# Patient Record
Sex: Female | Born: 1995 | Race: Black or African American | Hispanic: No | Marital: Single | Smoking: Current some day smoker
Health system: Southern US, Community
[De-identification: ages and names within clinical notes are randomized; demographics above are authoritative.]

## PROBLEM LIST (undated history)

## (undated) DIAGNOSIS — F329 Major depressive disorder, single episode, unspecified: Secondary | ICD-10-CM

## (undated) DIAGNOSIS — F32A Depression, unspecified: Secondary | ICD-10-CM

## (undated) DIAGNOSIS — D649 Anemia, unspecified: Secondary | ICD-10-CM

## (undated) DIAGNOSIS — F419 Anxiety disorder, unspecified: Secondary | ICD-10-CM

## (undated) DIAGNOSIS — F429 Obsessive-compulsive disorder, unspecified: Secondary | ICD-10-CM

---
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
## 2018-01-26 ENCOUNTER — Ambulatory Visit: Payer: Self-pay | Admitting: Certified Nurse Midwife

## 2018-06-16 ENCOUNTER — Emergency Department (HOSPITAL_COMMUNITY)
Admission: EM | Admit: 2018-06-16 | Discharge: 2018-06-17 | Disposition: A | Discharge status: Other Acute Inpatient Hospital | Payer: Self-pay | Attending: Emergency Medicine | Admitting: Emergency Medicine

## 2018-06-16 ENCOUNTER — Other Ambulatory Visit: Payer: Self-pay

## 2018-06-16 ENCOUNTER — Encounter (HOSPITAL_COMMUNITY): Payer: Self-pay | Admitting: Emergency Medicine

## 2018-06-16 DIAGNOSIS — F17210 Nicotine dependence, cigarettes, uncomplicated: Secondary | ICD-10-CM | POA: Insufficient documentation

## 2018-06-16 DIAGNOSIS — R45851 Suicidal ideations: Secondary | ICD-10-CM | POA: Insufficient documentation

## 2018-06-16 DIAGNOSIS — Z79899 Other long term (current) drug therapy: Secondary | ICD-10-CM | POA: Insufficient documentation

## 2018-06-16 DIAGNOSIS — T50902A Poisoning by unspecified drugs, medicaments and biological substances, intentional self-harm, initial encounter: Secondary | ICD-10-CM | POA: Insufficient documentation

## 2018-06-16 DIAGNOSIS — F322 Major depressive disorder, single episode, severe without psychotic features: Secondary | ICD-10-CM | POA: Insufficient documentation

## 2018-06-16 DIAGNOSIS — F411 Generalized anxiety disorder: Secondary | ICD-10-CM | POA: Insufficient documentation

## 2018-06-16 LAB — URINALYSIS, ROUTINE W REFLEX MICROSCOPIC
Bilirubin Urine: NEGATIVE
Glucose, UA: NEGATIVE mg/dL
Ketones, ur: 5 mg/dL — AB
Nitrite: NEGATIVE
Protein, ur: 30 mg/dL — AB
Specific Gravity, Urine: 1.027 (ref 1.005–1.030)
pH: 5 (ref 5.0–8.0)

## 2018-06-16 LAB — CBC
HCT: 41.7 % (ref 36.0–46.0)
Hemoglobin: 13 g/dL (ref 12.0–15.0)
MCH: 26.9 pg (ref 26.0–34.0)
MCHC: 31.2 g/dL (ref 30.0–36.0)
MCV: 86.2 fL (ref 80.0–100.0)
Platelets: 375 10*3/uL (ref 150–400)
RBC: 4.84 MIL/uL (ref 3.87–5.11)
RDW: 14 % (ref 11.5–15.5)
WBC: 13.2 10*3/uL — ABNORMAL HIGH (ref 4.0–10.5)
nRBC: 0 % (ref 0.0–0.2)

## 2018-06-16 LAB — I-STAT BETA HCG BLOOD, ED (MC, WL, AP ONLY): I-stat hCG, quantitative: <5 m[IU]/mL (ref <5)

## 2018-06-16 NOTE — ED Notes (Signed)
Pt's belongings put in [place] [address]

## 2018-06-16 NOTE — ED Triage Notes (Addendum)
Pt reports last night attempting to overdose d/t stress in her life. Pt took 10 40mg  capsules of Fluoxetine, "a lot maybe 10-15" of Propanolol 20mg  tablets, and 8 200mg  capsules of  Carbamazepine. States she is prescribed these medications but has not taken them in years. Pt denies SI at this time but reports she was last night and now she just feels depressed. Pt reports talking to family today and said that she realizes it's not that serious. Pt is here for mental health help. Pt denies HI and hallucinations. Pt reports today she felt like she couldn't eat any food. Reports mid and upper abd pain and nausea.   Hx of Depression and Anxiety.

## 2018-06-16 NOTE — ED Notes (Signed)
Pt changed into purple scrubs. Security notifed for wanding. Staffing notifed for sitter.

## 2018-06-16 NOTE — ED Notes (Signed)
Pt wanded by security. 

## 2018-06-17 ENCOUNTER — Inpatient Hospital Stay (HOSPITAL_COMMUNITY)
Admission: AD | Admit: 2018-06-17 | Discharge: 2018-06-20 | DRG: 885 | Disposition: A | Discharge status: Home / Self Care | Payer: Federal, State, Local not specified - Other | Source: Intra-hospital | Attending: Psychiatry | Admitting: Psychiatry

## 2018-06-17 ENCOUNTER — Encounter (HOSPITAL_COMMUNITY): Payer: Self-pay

## 2018-06-17 ENCOUNTER — Other Ambulatory Visit: Payer: Self-pay

## 2018-06-17 DIAGNOSIS — Z91018 Allergy to other foods: Secondary | ICD-10-CM | POA: Diagnosis not present

## 2018-06-17 DIAGNOSIS — F429 Obsessive-compulsive disorder, unspecified: Secondary | ICD-10-CM | POA: Diagnosis present

## 2018-06-17 DIAGNOSIS — Z915 Personal history of self-harm: Secondary | ICD-10-CM | POA: Diagnosis not present

## 2018-06-17 DIAGNOSIS — Z23 Encounter for immunization: Secondary | ICD-10-CM | POA: Diagnosis not present

## 2018-06-17 DIAGNOSIS — F332 Major depressive disorder, recurrent severe without psychotic features: Principal | ICD-10-CM | POA: Diagnosis present

## 2018-06-17 DIAGNOSIS — F17210 Nicotine dependence, cigarettes, uncomplicated: Secondary | ICD-10-CM | POA: Diagnosis present

## 2018-06-17 DIAGNOSIS — Z975 Presence of (intrauterine) contraceptive device: Secondary | ICD-10-CM | POA: Diagnosis not present

## 2018-06-17 DIAGNOSIS — Z7251 High risk heterosexual behavior: Secondary | ICD-10-CM | POA: Diagnosis not present

## 2018-06-17 DIAGNOSIS — T50902A Poisoning by unspecified drugs, medicaments and biological substances, intentional self-harm, initial encounter: Secondary | ICD-10-CM

## 2018-06-17 DIAGNOSIS — T1491XA Suicide attempt, initial encounter: Secondary | ICD-10-CM | POA: Diagnosis not present

## 2018-06-17 DIAGNOSIS — F4310 Post-traumatic stress disorder, unspecified: Secondary | ICD-10-CM | POA: Diagnosis present

## 2018-06-17 DIAGNOSIS — F1010 Alcohol abuse, uncomplicated: Secondary | ICD-10-CM | POA: Diagnosis present

## 2018-06-17 DIAGNOSIS — Z7952 Long term (current) use of systemic steroids: Secondary | ICD-10-CM | POA: Diagnosis not present

## 2018-06-17 DIAGNOSIS — Z79899 Other long term (current) drug therapy: Secondary | ICD-10-CM

## 2018-06-17 DIAGNOSIS — Z62810 Personal history of physical and sexual abuse in childhood: Secondary | ICD-10-CM | POA: Diagnosis present

## 2018-06-17 DIAGNOSIS — F1220 Cannabis dependence, uncomplicated: Secondary | ICD-10-CM | POA: Diagnosis present

## 2018-06-17 DIAGNOSIS — Z888 Allergy status to other drugs, medicaments and biological substances status: Secondary | ICD-10-CM

## 2018-06-17 DIAGNOSIS — R45851 Suicidal ideations: Secondary | ICD-10-CM | POA: Diagnosis present

## 2018-06-17 DIAGNOSIS — R001 Bradycardia, unspecified: Secondary | ICD-10-CM | POA: Diagnosis not present

## 2018-06-17 DIAGNOSIS — Z818 Family history of other mental and behavioral disorders: Secondary | ICD-10-CM

## 2018-06-17 LAB — RAPID URINE DRUG SCREEN, HOSP PERFORMED
Amphetamines: NOT DETECTED
Barbiturates: NOT DETECTED
Benzodiazepines: NOT DETECTED
Cocaine: NOT DETECTED
Opiates: NOT DETECTED
Tetrahydrocannabinol: POSITIVE — AB

## 2018-06-17 LAB — COMPREHENSIVE METABOLIC PANEL
ALT: 17 U/L (ref 0–44)
AST: 15 U/L (ref 15–41)
Albumin: 3.9 g/dL (ref 3.5–5.0)
Alkaline Phosphatase: 56 U/L (ref 38–126)
Anion gap: 11 (ref 5–15)
BUN: 10 mg/dL (ref 6–20)
CO2: 23 mmol/L (ref 22–32)
Calcium: 9.5 mg/dL (ref 8.9–10.3)
Chloride: 102 mmol/L (ref 98–111)
Creatinine, Ser: 0.88 mg/dL (ref 0.44–1.00)
GFR calc Af Amer: >60 mL/min (ref >60)
GFR calc non Af Amer: >60 mL/min (ref >60)
Glucose, Bld: 97 mg/dL (ref 70–99)
Potassium: 3.8 mmol/L (ref 3.5–5.1)
Sodium: 136 mmol/L (ref 135–145)
Total Bilirubin: 0.4 mg/dL (ref 0.3–1.2)
Total Protein: 7.3 g/dL (ref 6.5–8.1)

## 2018-06-17 LAB — ACETAMINOPHEN LEVEL: Acetaminophen (Tylenol), Serum: <10 ug/mL — ABNORMAL LOW (ref 10–30)

## 2018-06-17 LAB — CBG MONITORING, ED: Glucose-Capillary: 77 mg/dL (ref 70–99)

## 2018-06-17 LAB — LIPASE, BLOOD: Lipase: 27 U/L (ref 11–51)

## 2018-06-17 LAB — SALICYLATE LEVEL: Salicylate Lvl: <7 mg/dL (ref 2.8–30.0)

## 2018-06-17 LAB — ETHANOL: Alcohol, Ethyl (B): <10 mg/dL (ref <10)

## 2018-06-17 LAB — CARBAMAZEPINE LEVEL, TOTAL: Carbamazepine Lvl: 9.3 ug/mL (ref 4.0–12.0)

## 2018-06-17 MED ORDER — HYDROXYZINE HCL 25 MG PO TABS
25.0000 mg | ORAL_TABLET | Freq: Four times a day (QID) | ORAL | Status: DC | PRN
  Administered 2018-06-17 – 2018-06-19 (×3): 25 mg via ORAL
  Filled 2018-06-17: qty 10
  Filled 2018-06-17 (×3): qty 1

## 2018-06-17 MED ORDER — INFLUENZA VAC SPLIT QUAD 0.5 ML IM SUSY
0.5000 mL | PREFILLED_SYRINGE | INTRAMUSCULAR | Status: AC
  Administered 2018-06-18: 0.5 mL via INTRAMUSCULAR
  Filled 2018-06-17: qty 0.5

## 2018-06-17 MED ORDER — PNEUMOCOCCAL VAC POLYVALENT 25 MCG/0.5ML IJ INJ
0.5000 mL | INJECTION | INTRAMUSCULAR | Status: AC
  Administered 2018-06-18: 0.5 mL via INTRAMUSCULAR

## 2018-06-17 MED ORDER — CITALOPRAM HYDROBROMIDE 10 MG PO TABS
10.0000 mg | ORAL_TABLET | Freq: Every day | ORAL | Status: DC
  Administered 2018-06-17 – 2018-06-20 (×4): 10 mg via ORAL
  Filled 2018-06-17: qty 7
  Filled 2018-06-17 (×7): qty 1

## 2018-06-17 MED ORDER — ENSURE ENLIVE PO LIQD: 237.0000 mL | Freq: Two times a day (BID) | ORAL | Status: DC

## 2018-06-17 MED ORDER — ONDANSETRON 4 MG PO TBDP
4.0000 mg | ORAL_TABLET | Freq: Once | ORAL | Status: AC
  Administered 2018-06-17: 4 mg via ORAL
  Filled 2018-06-17: qty 1

## 2018-06-17 MED ORDER — TRAZODONE HCL 50 MG PO TABS
50.0000 mg | ORAL_TABLET | Freq: Every evening | ORAL | Status: DC | PRN
  Administered 2018-06-19: 50 mg via ORAL
  Filled 2018-06-17: qty 7
  Filled 2018-06-17: qty 1

## 2018-06-17 MED ORDER — ACETAMINOPHEN 325 MG PO TABS
650.0000 mg | ORAL_TABLET | Freq: Four times a day (QID) | ORAL | Status: DC | PRN
  Administered 2018-06-18 – 2018-06-20 (×3): 650 mg via ORAL
  Filled 2018-06-17 (×3): qty 2
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
## 2018-06-17 NOTE — ED Notes (Signed)
Called lab to check on collected blood not in process yet.

## 2018-06-17 NOTE — Progress Notes (Signed)
Spoke with Verlon Au in poison control. Informed that pt had ingested 8 200mg  of tegretol. Instructed to have level drawn and if within range pt would be ok to transfer.
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
## 2018-06-17 NOTE — [facility] Group Notes (Signed)
[facility] Group Notes:  (Nursing)  Date:  06/17/2018  Time:  1:15 PM Type of Therapy:  Nurse Education  Participation Level:  Did Not Attend  Shela Nevin 06/17/2018, 5:25 PM

## 2018-06-17 NOTE — ED Notes (Signed)
Declined W/C at D/C and was escorted to lobby by RN. 

## 2018-06-17 NOTE — ED Notes (Signed)
Regular diet tray ordered for breakfast. 

## 2018-06-17 NOTE — ED Notes (Signed)
Pts belongings inventoried and placed in locker #1 

## 2018-06-17 NOTE — ED Notes (Signed)
Spoke with poison control regarding pt's OD. Reports all medications should be out of pt's system by now. No further follow up or lab work needed.
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
## 2018-06-17 NOTE — ED Notes (Signed)
Reg food tray ordered @ 9:55am for lunch
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
## 2018-06-17 NOTE — Progress Notes (Signed)
The patient shared with the group that she slept for nearly the entire day since she is trying to catch up on her rest. Her goal for tomorrow is to be more involved.

## 2018-06-17 NOTE — ED Notes (Signed)
TTS in progress 

## 2018-06-17 NOTE — Progress Notes (Signed)
Writer has observed patient up in the dayroom laughing and talking with peers. Writer spoke with her 1:1 and she reports that she plans to focus on herself more instead of helping others and feeling used. She was informed of medications available if needed. Support given and safety maintained on unit with 15 min checks

## 2018-06-17 NOTE — ED Notes (Signed)
ED Provider at bedside. 

## 2018-06-17 NOTE — ED Notes (Signed)
HR reported to [employer], Charity fundraiser
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
## 2018-06-18 LAB — TSH: TSH: 1.643 u[IU]/mL (ref 0.350–4.500)

## 2018-06-18 MED ORDER — ENSURE ENLIVE PO LIQD: 237.0000 mL | Freq: Two times a day (BID) | ORAL | Status: DC | PRN

## 2018-06-18 NOTE — [facility] Suicide Risk Assessment (Signed)
[facility] INPATIENT:  Family/Significant Other Suicide Prevention Education  Suicide Prevention Education:  Patient Refusal for Family/Significant Other Suicide Prevention Education: The patient Kelsey Collins has refused to provide written consent for family/significant other to be provided Family/Significant Other Suicide Prevention Education during admission and/or prior to discharge. CSW provided SPE information to the patient and she was provided with literature and contact information  Physician notified.  Evorn Gong 06/18/2018, 5:04 PM

## 2018-06-18 NOTE — [facility] Group Notes (Signed)
[facility] LCSW Group Therapy Note  Date/Time:  06/18/2018 9:00-10:00 or 10:00-11:00AM  Type of Therapy and Topic:  Group Therapy:  Healthy and Unhealthy Supports  Participation Level:  Active   Description of Group:  Patients in this group were introduced to the idea of adding a variety of healthy supports to address the various needs in their lives.Patients discussed what additional healthy supports could be helpful in their recovery and wellness after discharge in order to prevent future hospitalizations.   An emphasis was placed on using counselor, doctor, therapy groups, 12-step groups, and problem-specific support groups to expand supports.  They also worked as a group on developing a specific plan for several patients to deal with unhealthy supports through boundary-setting, psychoeducation with loved ones, and even termination of relationships.   Therapeutic Goals:   1)  discuss importance of adding supports to stay well once out of the hospital  2)  compare healthy versus unhealthy supports and identify some examples of each  3)  generate ideas and descriptions of healthy supports that can be added  4)  offer mutual support about how to address unhealthy supports  5)  encourage active participation in and adherence to discharge plan    Summary of Patient Progress:  The patient stated that current healthy supports in her life are her grandparents and her dad while current unhealthy supports include her ex-girlfriend . The patient expressed a willingness to add therapy and psychiatry  as supports to help in her recovery journey.   Therapeutic Modalities:   Motivational Interviewing Brief Solution-Focused Therapy  Evorn Gong, LCSW  Evorn Gong

## 2018-06-18 NOTE — Progress Notes (Signed)
Nutrition Brief Note  Patient identified on the Malnutrition Screening Tool (MST) Report  Patient has gained weight per weight records below.  Wt Readings from Last 15 Encounters:  06/17/18 98.9 kg (99 %, Z= 2.19)*  06/16/18 97.5 kg (98 %, Z= 2.15)*  06/20/16 96.7 kg (98 %, Z= 2.13)*  06/11/16 97.4 kg (98 %, Z= 2.15)*  05/13/16 95.3 kg (98 %, Z= 2.10)*  05/12/16 95.3 kg (98 %, Z= 2.10)*  12/30/15 89.8 kg (98 %, Z= 1.97)*  02/07/15 88.5 kg (98 %, Z= 1.97)*  10/15/14 84.5 kg (97 %, Z= 1.87)*  10/03/14 86.5 kg (97 %, Z= 1.94)*  05/04/14 79 kg (96 %, Z= 1.70)*  05/02/14 78.5 kg (95 %, Z= 1.68)*  09/04/13 83.9 kg (97 %, Z= 1.95)*  08/23/13 83.3 kg (97 %, Z= 1.93)*  08/09/12 88.6 kg (99 %, Z= 2.26)*   * Growth percentiles are based on CDC (Girls, 2-20 Years) data.    Body mass index is 37.42 kg/m. Patient meets criteria for obesity based on current BMI.  Labs and medications reviewed.   No nutrition interventions warranted at this time. If nutrition issues arise, please consult RD.   Tilda Franco, MS, RD, LDN [facility] Inpatient Clinical Dietitian Pager: [phone] After Hours Pager: [phone]

## 2018-06-18 NOTE — Progress Notes (Signed)
[facility] MD Progress Note  06/18/2018 2:51 PM Kelsey Collins  MRN:  [id number] Subjective: Patient reports some improvement compared to admission.  Denies suicidal ideations.  Tolerating Celexa trial well thus far. Objective: I reviewed the chart notes and have met with patient. 22 year old single female, lives with grandparents, status post overdose on carbamazepine, fluoxetine, propranolol.  Reports worsening depression in the context of significant psychosocial stressors.  Has not been taking psychiatric medications over the last 2 years. Today she reports some improvement compared to how she felt on admission.  She does present with a more reactive affect and has been noted to be visible and interactive on the unit, conversing with peers. Denies suicidal ideations, denies self-injurious ideations, at this time contracts for safety on unit. Thus far tolerating Celexa well. We have reviewed psychiatric history-patient endorses history of depression and history of anxiety.  She also reports history of vague irritability which she associates with depression and psychosocial stressors.  Does not endorse history of mania or hypomania at this time.  She states she had been prescribed Topamax in the past for migraine prophylaxis, has not been taking it recently. Labs reviewed- TSH 1.64 Principal Problem: MDD Diagnosis:   Patient Active Problem List   Diagnosis Date Noted  . Encounter for IUD insertion [Z30.430] 05/13/2016  . Breakthrough bleeding on Nexplanon [N92.1, Z97.5] 05/12/2016  . High risk sexual behavior [Z72.51] 05/12/2016  . Obesity [E66.9] 05/12/2016  . Acute drug intoxication with delirium ([place]) [Y07.371] 05/10/2014  . PTSD (post-traumatic stress disorder) [F43.10] 05/03/2014  . Moderate recurrent major depression ([place]) [F33.1] 05/03/2014   Total Time spent with patient: 20 minutes  Past Psychiatric History:   Past Medical History:  Past Medical History:  Diagnosis Date  . Anemia    . Anxiety   . Depression   . OCD (obsessive compulsive disorder)    History reviewed. No pertinent surgical history. Family History:  Family History  Problem Relation Age of Onset  . Cancer Maternal Aunt   . Cancer Maternal Grandmother   . Depression Maternal Grandfather   . Diabetes Maternal Grandfather    Family Psychiatric  History:  Social History:  Social History   Substance and Sexual Activity  Alcohol Use Yes  . Alcohol/week: 0.0 standard drinks   Comment: socially     Social History   Substance and Sexual Activity  Drug Use Yes  . Types: Marijuana   Comment: everyday     Social History   Socioeconomic History  . Marital status: Single    Spouse name: Not on file  . Number of children: Not on file  . Years of education: Not on file  . Highest education level: Not on file  Occupational History  . Not on file  Social Needs  . Financial resource strain: Not on file  . Food insecurity:    Worry: Not on file    Inability: Not on file  . Transportation needs:    Medical: Not on file    Non-medical: Not on file  Tobacco Use  . Smoking status: Current Some Day Smoker    Types: Cigarettes  . Smokeless tobacco: Never Used  . Tobacco comment: 1-2 black and milds/everyday  Substance and Sexual Activity  . Alcohol use: Yes    Alcohol/week: 0.0 standard drinks    Comment: socially  . Drug use: Yes    Types: Marijuana    Comment: everyday   . Sexual activity: Yes    Birth control/protection: Implant  Lifestyle  .  Physical activity:    Days per week: Not on file    Minutes per session: Not on file  . Stress: Not on file  Relationships  . Social connections:    Talks on phone: Not on file    Gets together: Not on file    Attends religious service: Not on file    Active member of club or organization: Not on file    Attends meetings of clubs or organizations: Not on file    Relationship status: Not on file  Other Topics Concern  . Not on file  Social  History Narrative  . Not on file   Additional Social History:   Sleep: Good  Appetite:  Good  Current Medications: Current Facility-Administered Medications  Medication Dose Route Frequency Provider Last Rate Last Dose  . acetaminophen (TYLENOL) tablet 650 mg  650 mg Oral Q6H PRN Carletta Feasel, Myer Peer, MD   650 mg at 06/18/18 1447  . citalopram (CELEXA) tablet 10 mg  10 mg Oral Daily Erum Cercone, Myer Peer, MD   10 mg at 06/18/18 0742  . feeding supplement (ENSURE ENLIVE) (ENSURE ENLIVE) liquid 237 mL  237 mL Oral BID PRN Arlin Sass, Myer Peer, MD      . hydrOXYzine (ATARAX/VISTARIL) tablet 25 mg  25 mg Oral Q6H PRN Freyja Govea, Myer Peer, MD   25 mg at 06/18/18 0744  . traZODone (DESYREL) tablet 50 mg  50 mg Oral QHS PRN Charley Miske, Myer Peer, MD        Lab Results:  Results for orders placed or performed during the hospital encounter of 06/17/18 (from the past 48 hour(s))  TSH     Status: None   Collection Time: 06/18/18  6:34 AM  Result Value Ref Range   TSH 1.643 0.350 - 4.500 uIU/mL    Comment: Performed by a 3rd Generation assay with a functional sensitivity of <=0.01 uIU/mL. Performed at [facility], [place] [address] [place], [place] [address]     Blood Alcohol level:  Lab Results  Component Value Date   [facility] <10 06/16/2018   ETH <11 41/93/7902    Metabolic Disorder Labs: No results found for: HGBA1C, MPG No results found for: PROLACTIN No results found for: CHOL, TRIG, HDL, CHOLHDL, VLDL, LDLCALC  Physical Findings: AIMS:  , ,  ,  ,    CIWA:    COWS:     Musculoskeletal: Strength & Muscle Tone: within normal limits Gait & Station: normal Patient leans: N/A  Psychiatric Specialty Exam: Physical Exam  ROS reports headaches, currently mild, no chest pain, no shortness of breath, no vomiting  Blood pressure 108/82, pulse (!) 56, temperature 98.6 F (37 C), temperature source Oral, resp. rate 18, height '5\' 4"'$  (1.626 m), weight 98.9 kg, last menstrual period  05/19/2018, SpO2 100 %.Body mass index is 37.42 kg/m.  General Appearance: Improving grooming  Eye Contact:  Good  Speech:  Normal Rate  Volume:  Normal  Mood:  Reports mood better today  Affect:  Less constricted, smiles briefly at times during session  Thought Process:  Linear and Descriptions of Associations: Intact  Orientation:  Other:  Fully alert and attentive  Thought Content:  No hallucinations, no delusions, not internally preoccupied  Suicidal Thoughts:  No-at this time denies suicidal or self-injurious ideations, no homicidal or violent ideations  Homicidal Thoughts:  No  Memory:  Recent and remote grossly intact  Judgement: Improving  Insight:  Fair  Psychomotor Activity:  Normal  Concentration:  Concentration: Good  and Attention Span: Good  Recall:  Good  Fund of Knowledge:  Good  Language:  Good  Akathisia:  Negative  Handed:  Right  AIMS (if indicated):     Assets:  Communication Skills Desire for Improvement Resilience  ADL's:  Intact  Cognition:  WNL  Sleep:  Number of Hours: 6.5    Assessment -  22 year old single female, lives with grandparents, status post overdose on carbamazepine, fluoxetine, propranolol.  Reports worsening depression in the context of significant psychosocial stressors.  Has not been taking psychiatric medications over the last 2 years.  Today patient reports feeling better, less depressed, does present with a more reactive range of affect.  Thus far is tolerating medication trial well (currently on Celexa).  Reports prior management with Tegretol and with Topamax.  Has not been taking recently.  Reports Topamax was prescribed for migraine prophylaxis, not for psychiatric medication.  Denies any clear history of hypomania or mania.. Treatment Plan Summary: Daily contact with patient to assess and evaluate symptoms and progress in treatment, Medication management, Plan Inpatient treatment and Medications as below Encourage group and milieu  participation to work on coping skills and symptom reduction Continue Celexa 10 mg daily for depression and anxiety Continue Vistaril 25 mg every 6 hours PRN for anxiety Continue Trazodone 50 mg nightly PRN for insomnia Treatment team working on disposition [place], MD 06/18/2018, 2:51 PM
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
## 2018-06-18 NOTE — Progress Notes (Signed)
Patient ID: Kelsey Collins, female   DOB: [birth year], 22 y.o.   MRN: [id number]  Nursing Progress Note  Data: Patient presents with animated affect. Patient is seen up in the milieu and attends wrap up group this evening. Patient requests to sign 72 hour request for discharge. Patient denies other concerns for writer this evening. Patient currently denies SI/HI/AVH. Patient educated about available PRN sleep medications.  Action: Patient safety maintained with q15 min safety checks and frequent rounding. Low fall risk precautions in place. Emotional support given. 1:1 interaction and active listening provided. Labs, vital signs and patient behavior monitored throughout shift.   Response: Patient remains safe on the unit at this time and agrees to come to staff with any issues/concerns. Will continue to support and monitor. Signed 72 request for discharge signed at 2030.

## 2018-06-18 NOTE — Progress Notes (Signed)
D. Pt has been calm and cooperative, friendly during interactions- visible on the unit attending groups. Per pt's self inventory, pt rates her depression, hopelessness and anxiety a 2/0/3, respectively.Pt reports having slept well last night, endorses good concentration and normal energy level. Pt writes that her goal today is "focusing on myself" and "go to group".Pt currently denies SI/HI and AV hallucinations.A. Labs and vitals monitored. Pt compliant with medications. Pt supported emotionally and encouraged to express concerns and ask questions.   R. Pt remains safe with 15 minute checks. Will continue POC.

## 2018-06-18 NOTE — [facility] Group Notes (Signed)
[facility] Group Notes:  (Nursing)  Date:  06/18/2018  Time: 1:15 PM Type of Therapy:  Nurse Education  Participation Level:  Active  Participation Quality:  Appropriate  Affect:  Appropriate  Cognitive:  Appropriate  Insight:  Appropriate  Engagement in Group:  Engaged  Modes of Intervention:  Discussion and Education  Summary of Progress/Problems: Nurse led group - [employer] Shela Nevin 06/18/2018, 7:23 PM

## 2018-06-19 NOTE — [facility] Group Notes (Signed)
[facility] Group Notes:  (Nursing/MHT/Case Management/Adjunct)  Date:  06/19/2018  Time:  4:15 p m  Type of Therapy:  Psychoeducational Skills  Participation Level:  Active  Participation Quality:  Appropriate  Affect:  Appropriate  Cognitive:  Appropriate  Insight:  Appropriate  Engagement in Group:  Engaged  Modes of Intervention:  Education  Summary of Progress/Problems: Patient attended group, listened and responded appropriately.    Earline Mayotte 06/19/2018, 6:23 PM

## 2018-06-19 NOTE — Tx Team (Signed)
Interdisciplinary Treatment and Diagnostic Plan Update  06/19/2018 Time of Session:  Kelsey Collins MRN: [id number]  Principal Diagnosis: <principal problem not specified>  Secondary Diagnoses: Active Problems:   * No active hospital problems. *   Current Medications:  Current Facility-Administered Medications  Medication Dose Route Frequency Provider Last Rate Last Dose  . acetaminophen (TYLENOL) tablet 650 mg  650 mg Oral Q6H PRN Cobos, Rockey Situ, MD   650 mg at 06/19/18 0643  . citalopram (CELEXA) tablet 10 mg  10 mg Oral Daily Cobos, Rockey Situ, MD   10 mg at 06/18/18 0742  . feeding supplement (ENSURE ENLIVE) (ENSURE ENLIVE) liquid 237 mL  237 mL Oral BID PRN Cobos, Rockey Situ, MD      . hydrOXYzine (ATARAX/VISTARIL) tablet 25 mg  25 mg Oral Q6H PRN Cobos, Rockey Situ, MD   25 mg at 06/18/18 0744  . traZODone (DESYREL) tablet 50 mg  50 mg Oral QHS PRN Cobos, Rockey Situ, MD       PTA Medications: Medications Prior to Admission  Medication Sig Dispense Refill Last Dose  . acetaminophen (TYLENOL) 325 MG tablet Take 650 mg by mouth every 6 (six) hours as needed for mild pain or headache.     . carbamazepine (EQUETRO) 200 MG CP12 12 hr capsule Take 200 mg by mouth 2 (two) times daily.   Not Taking at Unknown time  . cyclobenzaprine (FLEXERIL) 10 MG tablet Take 1 tablet (10 mg total) by mouth every 8 (eight) hours as needed for muscle spasms. 30 tablet 1 06/19/2016 at Unknown time  . FLUoxetine (PROZAC) 40 MG capsule Take 40 mg by mouth daily.  1 Not Taking at Unknown time  . predniSONE (DELTASONE) 50 MG tablet Take 1 pill daily for 5 days. 5 tablet 0   . propranolol (INDERAL) 20 MG tablet Take 20 mg by mouth 2 (two) times daily.   Not Taking at Unknown time  . topiramate (TOPAMAX) 50 MG tablet Take 50 mg by mouth at bedtime.  1 Not Taking at Unknown time    Patient Stressors: Loss of relationship (girlfriend and mother) Medication change or noncompliance Traumatic event  Patient  Strengths: Ability for insight Average or above average intelligence Capable of independent living Communication skills Motivation for treatment/growth Physical Health Supportive family/friends Work skills  Treatment Modalities: Medication Management, Group therapy, Case management,  1 to 1 session with clinician, Psychoeducation, Recreational therapy.   Physician Treatment Plan for Primary Diagnosis: <principal problem not specified> Long Term Goal(s): Improvement in symptoms so as ready for discharge Improvement in symptoms so as ready for discharge   Short Term Goals: Ability to identify changes in lifestyle to reduce recurrence of condition will improve Ability to maintain clinical measurements within normal limits will improve Ability to identify changes in lifestyle to reduce recurrence of condition will improve Ability to maintain clinical measurements within normal limits will improve  Medication Management: Evaluate patient's response, side effects, and tolerance of medication regimen.  Therapeutic Interventions: 1 to 1 sessions, Unit Group sessions and Medication administration.  Evaluation of Outcomes: Progressing  Physician Treatment Plan for Secondary Diagnosis: Active Problems:   * No active hospital problems. *  Long Term Goal(s): Improvement in symptoms so as ready for discharge Improvement in symptoms so as ready for discharge   Short Term Goals: Ability to identify changes in lifestyle to reduce recurrence of condition will improve Ability to maintain clinical measurements within normal limits will improve Ability to identify changes in lifestyle to reduce  recurrence of condition will improve Ability to maintain clinical measurements within normal limits will improve     Medication Management: Evaluate patient's response, side effects, and tolerance of medication regimen.  Therapeutic Interventions: 1 to 1 sessions, Unit Group sessions and Medication  administration.  Evaluation of Outcomes: Progressing   RN Treatment Plan for Primary Diagnosis: <principal problem not specified> Long Term Goal(s): Knowledge of disease and therapeutic regimen to maintain health will improve  Short Term Goals: Ability to demonstrate self-control, Ability to participate in decision making will improve, Ability to verbalize feelings will improve, Ability to disclose and discuss suicidal ideas, Ability to identify and develop effective coping behaviors will improve and Compliance with prescribed medications will improve  Medication Management: RN will administer medications as ordered by provider, will assess and evaluate patient's response and provide education to patient for prescribed medication. RN will report any adverse and/or side effects to prescribing provider.  Therapeutic Interventions: 1 on 1 counseling sessions, Psychoeducation, Medication administration, Evaluate responses to treatment, Monitor vital signs and CBGs as ordered, Perform/monitor CIWA, COWS, AIMS and Fall Risk screenings as ordered, Perform wound care treatments as ordered.  Evaluation of Outcomes: Progressing   LCSW Treatment Plan for Primary Diagnosis: <principal problem not specified> Long Term Goal(s): Safe transition to appropriate next level of care at discharge, Engage patient in therapeutic group addressing interpersonal concerns.  Short Term Goals: Engage patient in aftercare planning with referrals and resources  Therapeutic Interventions: Assess for all discharge needs, 1 to 1 time with Social worker, Explore available resources and support systems, Assess for adequacy in community support network, Educate family and significant other(s) on suicide prevention, Complete Psychosocial Assessment, Interpersonal group therapy.  Evaluation of Outcomes: Progressing   Progress in Treatment: Attending groups: Yes. Participating in groups: Yes. Taking medication as prescribed:  Yes. Toleration medication: Yes. Family/Significant other contact made: No, will contact:  patient refused consent Patient understands diagnosis: Yes. Discussing patient identified problems/goals with staff: Yes. Medical problems stabilized or resolved: Yes. Denies suicidal/homicidal ideation: Yes. Issues/concerns per patient self-inventory: No. Other:   New problem(s) identified: None   New Short Term/Long Term Goal(s): medication stabilization, elimination of SI thoughts, development of comprehensive mental wellness plan.    Patient Goals:  I need to focus on myself   Discharge Plan or Barriers: CSW will assess for appropriate referrals and discharge planning.  Reason for Continuation of Hospitalization: Depression Medication stabilization Suicidal ideation  Estimated Length of Stay: 3-5 days   Attendees: Patient: 06/19/2018 8:41 AM  Physician: Dr. Nehemiah Massed, MD 06/19/2018 8:41 AM  Nursing: Meriam Sprague.Kirtland Bouchard, RN 06/19/2018 8:41 AM  RN Care Manager: Onnie Boer, RN 06/19/2018 8:41 AM  Social Worker: Baldo Daub, LCSWA 06/19/2018 8:41 AM  Recreational Therapist: Juliann Pares 06/19/2018 8:41 AM  Other:  06/19/2018 8:41 AM  Other:  06/19/2018 8:41 AM  Other: 06/19/2018 8:41 AM    Scribe for Treatment Team: Maeola Sarah, LCSWA 06/19/2018 8:41 AM

## 2018-06-19 NOTE — Progress Notes (Signed)
Patient ID: Kelsey Collins, female   DOB: 1996-01-23, 22 y.o.   MRN: [id number] D: Assumed care patient @ 2330. Patient in bed sleeping. Respiration regular and unlabored. No sign of distress noted at this time A: 15 mins checks for safety. R: Patient remains safe.

## 2018-06-19 NOTE — Progress Notes (Signed)
Recreation Therapy Notes  Date: 10.21.19 Time: 0930 Location: [address] Dayroom  Group Topic: Stress Management  Goal Area(s) Addresses:  Patient will verbalize importance of using healthy stress management.  Patient will identify positive emotions associated with healthy stress management.   Intervention: Stress Management  Activity : Meditation.  LRT introduced the stress management technique of meditation.  LRT played a meditation that dealt with impermanence.  Patients were to follow along as the meditation played to engage in the meditation.  Education:  Stress Management, Discharge Planning.   Education Outcome: Acknowledges edcuation/In group clarification offered/Needs additional education  Clinical Observations/Feedback: Pt did not attend group.    Caroll Rancher, LRT/CTRS         Lillia Abed, Anup Brigham A 06/19/2018 11:28 AM

## 2018-06-19 NOTE — Progress Notes (Signed)
Pt was observed in the dayroom, seen interacting with peers. Pt appears animated/anxious in affect and mood. Pt denies SI/HI/AVH/Pain at this time. Pt states she hopes to leave tomorrow. No new c/o. PRN vistaril and trazodone offered and accepted. Support and encouragement offered. Will continue with POC.

## 2018-06-19 NOTE — Plan of Care (Signed)
Nurse discussed anxiety, depression, coping skills with patient. 

## 2018-06-19 NOTE — Progress Notes (Signed)
[facility] MD Progress Note  06/19/2018 4:23 PM Kelsey Collins  MRN:  [id number] Subjective: Patient reports improvement compared to admission.  Denies suicidal ideations.  Describes improved mood/minimizes depression or significant neurovegetative symptoms at this time.  Tolerating medications well, denies side effects.  Objective: I reviewed the chart notes and have met with patient. 22 year old single female, lives with grandparents, status post overdose on carbamazepine, fluoxetine, propranolol.  Reports worsening depression in the context of significant psychosocial stressors.  Has not been taking psychiatric medications over the last 2 years.  Patient presents with improving mood and fuller range of affect.  Currently minimizing depression, stating she feels better, although still reports fair energy level and appetite.  Denies suicidal ideations. She is on Celexa which she is tolerating well thus far, without side effects. No disruptive or agitated behaviors on unit, pleasant on approach. Reports history of bradycardia-pulse today 56. 10/19 EKG read as sinus bradycardia.  No associated symptoms-no dizziness, no lightheadedness, no presyncopal or syncopal symptoms/episodes. Has been visible on unit, going to groups.  Interactive with peers of around her age. Principal Problem: MDD Diagnosis:   Patient Active Problem List   Diagnosis Date Noted  . Encounter for IUD insertion [Z30.430] 05/13/2016  . Breakthrough bleeding on Nexplanon [N92.1, Z97.5] 05/12/2016  . High risk sexual behavior [Z72.51] 05/12/2016  . Obesity [E66.9] 05/12/2016  . Acute drug intoxication with delirium ([place]) [Q73.419] 05/10/2014  . PTSD (post-traumatic stress disorder) [F43.10] 05/03/2014  . Moderate recurrent major depression ([place]) [F33.1] 05/03/2014   Total Time spent with patient: 20 minutes  Past Psychiatric History:   Past Medical History:  Past Medical History:  Diagnosis Date  . Anemia   . Anxiety   .  Depression   . OCD (obsessive compulsive disorder)    History reviewed. No pertinent surgical history. Family History:  Family History  Problem Relation Age of Onset  . Cancer Maternal Aunt   . Cancer Maternal Grandmother   . Depression Maternal Grandfather   . Diabetes Maternal Grandfather    Family Psychiatric  History:  Social History:  Social History   Substance and Sexual Activity  Alcohol Use Yes  . Alcohol/week: 0.0 standard drinks   Comment: socially     Social History   Substance and Sexual Activity  Drug Use Yes  . Types: Marijuana   Comment: everyday     Social History   Socioeconomic History  . Marital status: Single    Spouse name: Not on file  . Number of children: Not on file  . Years of education: Not on file  . Highest education level: Not on file  Occupational History  . Not on file  Social Needs  . Financial resource strain: Not on file  . Food insecurity:    Worry: Not on file    Inability: Not on file  . Transportation needs:    Medical: Not on file    Non-medical: Not on file  Tobacco Use  . Smoking status: Current Some Day Smoker    Types: Cigarettes  . Smokeless tobacco: Never Used  . Tobacco comment: 1-2 black and milds/everyday  Substance and Sexual Activity  . Alcohol use: Yes    Alcohol/week: 0.0 standard drinks    Comment: socially  . Drug use: Yes    Types: Marijuana    Comment: everyday   . Sexual activity: Yes    Birth control/protection: Implant  Lifestyle  . Physical activity:    Days per week: Not on file  Minutes per session: Not on file  . Stress: Not on file  Relationships  . Social connections:    Talks on phone: Not on file    Gets together: Not on file    Attends religious service: Not on file    Active member of club or organization: Not on file    Attends meetings of clubs or organizations: Not on file    Relationship status: Not on file  Other Topics Concern  . Not on file  Social History Narrative   . Not on file   Additional Social History:   Sleep: Good  Appetite:  Good  Current Medications: Current Facility-Administered Medications  Medication Dose Route Frequency Provider Last Rate Last Dose  . acetaminophen (TYLENOL) tablet 650 mg  650 mg Oral Q6H PRN Addalee Kavanagh, Myer Peer, MD   650 mg at 06/19/18 0643  . citalopram (CELEXA) tablet 10 mg  10 mg Oral Daily Camden Knotek, Myer Peer, MD   10 mg at 06/19/18 0844  . feeding supplement (ENSURE ENLIVE) (ENSURE ENLIVE) liquid 237 mL  237 mL Oral BID PRN Juris Gosnell, Myer Peer, MD      . hydrOXYzine (ATARAX/VISTARIL) tablet 25 mg  25 mg Oral Q6H PRN Oluwaseyi Raffel, Myer Peer, MD   25 mg at 06/18/18 0744  . traZODone (DESYREL) tablet 50 mg  50 mg Oral QHS PRN Sascha Baugher, Myer Peer, MD        Lab Results:  Results for orders placed or performed during the hospital encounter of 06/17/18 (from the past 48 hour(s))  TSH     Status: None   Collection Time: 06/18/18  6:34 AM  Result Value Ref Range   TSH 1.643 0.350 - 4.500 uIU/mL    Comment: Performed by a 3rd Generation assay with a functional sensitivity of <=0.01 uIU/mL. Performed at [facility], [place] [address], [place], [place] [address]     Blood Alcohol level:  Lab Results  Component Value Date   [facility] <10 06/16/2018   ETH <11 82/50/0370    Metabolic Disorder Labs: No results found for: HGBA1C, MPG No results found for: PROLACTIN No results found for: CHOL, TRIG, HDL, CHOLHDL, VLDL, LDLCALC  Physical Findings: AIMS: Facial and Oral Movements Muscles of Facial Expression: None, normal Lips and Perioral Area: None, normal Jaw: None, normal Tongue: None, normal,Extremity Movements Upper (arms, wrists, hands, fingers): None, normal Lower (legs, knees, ankles, toes): None, normal, Trunk Movements Neck, shoulders, hips: None, normal, Overall Severity Severity of abnormal movements (highest score from questions above): None, normal Incapacitation due to abnormal movements: None,  normal Patient's awareness of abnormal movements (rate only patient's report): No Awareness, Dental Status Current problems with teeth and/or dentures?: No Does patient usually wear dentures?: No  CIWA:  CIWA-Ar Total: 1 COWS:  COWS Total Score: 1  Musculoskeletal: Strength & Muscle Tone: within normal limits Gait & Station: normal Patient leans: N/A  Psychiatric Specialty Exam: Physical Exam  ROS no lightheadedness, no dizziness, no chest pain, no shortness of breath, no vomiting  Blood pressure (!) 135/96, pulse (!) 56, temperature 98.1 F (36.7 C), temperature source Oral, resp. rate 18, height _0  (1.626 m), weight 98.9 kg, SpO2 100 %.Body mass index is 37.42 kg/m.  General Appearance: Improving grooming  Eye Contact:  Good  Speech:  Normal Rate  Volume:  Normal  Mood:  Improving mood  Affect:  More reactive  Thought Process:  Linear and Descriptions of Associations: Intact  Orientation:  Other:  Fully alert and  attentive  Thought Content:  No hallucinations, no delusions, not internally preoccupied  Suicidal Thoughts:  No-at this time denies suicidal or self-injurious ideations, no homicidal or violent ideations  Homicidal Thoughts:  No  Memory:  Recent and remote grossly intact  Judgement: Improving  Insight:  Fair/improving  Psychomotor Activity:  Normal  Concentration:  Concentration: Good and Attention Span: Good  Recall:  Good  Fund of Knowledge:  Good  Language:  Good  Akathisia:  Negative  Handed:  Right  AIMS (if indicated):     Assets:  Communication Skills Desire for Improvement Resilience  ADL's:  Intact  Cognition:  WNL  Sleep:  Number of Hours: 6    Assessment -  22 year old single female, lives with grandparents, status post overdose on carbamazepine, fluoxetine, propranolol.  Reports worsening depression in the context of significant psychosocial stressors.  Has not been taking psychiatric medications over the last 2 years.  Patient is  presenting with improving mood and range of affect.  Today denies suicidal ideations.  Currently tolerating Celexa well.   Treatment Plan Summary: Daily contact with patient to assess and evaluate symptoms and progress in treatment, Medication management, Plan Inpatient treatment and Medications as below  Treatment plan reviewed as below today 10/21 Encourage group and milieu participation to work on coping skills and symptom reduction Continue Celexa 10 mg daily for depression and anxiety Continue Vistaril 25 mg every 6 hours PRN for anxiety Continue Trazodone 50 mg nightly PRN for insomnia Treatment team working on disposition [place], MD 06/19/2018, 4:23 PM   Patient ID: Jacques Earthly, female   DOB: [birth year], 22 y.o.   MRN: [id number]

## 2018-06-19 NOTE — Progress Notes (Signed)
Pt did not attend wrap up group. Pt constantly uses bad language. Staff asked pt not to use bad language. RN notify

## 2018-06-19 NOTE — [facility] Group Notes (Signed)
LCSW Group Therapy Note 06/19/2018 2:01 PM  Type of Therapy and Topic: Group Therapy: Overcoming Obstacles  Participation Level: Active  Description of Group:  In this group patients will be encouraged to explore what they see as obstacles to their own wellness and recovery. They will be guided to discuss their thoughts, feelings, and behaviors related to these obstacles. The group will process together ways to cope with barriers, with attention given to specific choices patients can make. Each patient will be challenged to identify changes they are motivated to make in order to overcome their obstacles. This group will be process-oriented, with patients participating in exploration of their own experiences as well as giving and receiving support and challenge from other group members.  Therapeutic Goals: 1. Patient will identify personal and current obstacles as they relate to admission. 2. Patient will identify barriers that currently interfere with their wellness or overcoming obstacles.  3. Patient will identify feelings, thought process and behaviors related to these barriers. 4. Patient will identify two changes they are willing to make to overcome these obstacles:   Summary of Patient Progress  Kelsey Collins was engaged and participated throughout the group session. Kelsey Collins reports that her main obstacle is "not focusing on myself". Kelsey Collins reports that she has a fear of failing and that is why she avoids her own personal problems.    Therapeutic Modalities:  Cognitive Behavioral Therapy Solution Focused Therapy Motivational Interviewing Relapse Prevention Therapy   Alcario Drought Clinical Social Worker

## 2018-06-19 NOTE — Progress Notes (Signed)
Adult Psychoeducational Group Note  Date:  06/19/2018 Time:  10:56 PM  Group Topic/Focus:  Wrap-Up Group:   The focus of this group is to help patients review their daily goal of treatment and discuss progress on daily workbooks.  Participation Level:  Did Not Attend  Participation Quality:    Affect:    Cognitive:    Insight:   Engagement in Group:    Modes of Intervention:    Additional Comments:  Pt did initially attend but left after approx. 5 minutes.  Pt did not participate.  Shameeka Silliman 06/19/2018, 10:56 PM

## 2018-06-19 NOTE — Progress Notes (Signed)
D:  Patient's self inventory sheet, patient has poor sleep, no sleep medication.  Fair appetite, low energy level, good concentration.  Denied depression and hopeless, anxiety 1.  Withdrawals, cravings, irritability.  Denied SI.  Physical problems, pain, headaches.  Physical pain, arms head, worst pain #12 in past 24 hours.  Pain medicine not helpful.  Goal is discharge plan.  Plans to talk to MD/SW.  Does have discharge plans. A:  Medications administered per MD orders.  Emotional support and encouragement given patient. R:  Denied SI and HI, contracts for safety.  Denied A/V hallucinations.  Safety maintained with 15 minute checks.

## 2018-06-20 DIAGNOSIS — F332 Major depressive disorder, recurrent severe without psychotic features: Secondary | ICD-10-CM | POA: Diagnosis present

## 2018-06-20 MED ORDER — TRAZODONE HCL 50 MG PO TABS: 50.0000 mg | ORAL_TABLET | Freq: Every evening | ORAL | 0 refills | Status: DC | PRN

## 2018-06-20 MED ORDER — CITALOPRAM HYDROBROMIDE 10 MG PO TABS: 10.0000 mg | ORAL_TABLET | Freq: Every day | ORAL | 0 refills | Status: AC

## 2018-06-20 MED ORDER — HYDROXYZINE HCL 25 MG PO TABS: 25.0000 mg | ORAL_TABLET | Freq: Four times a day (QID) | ORAL | 0 refills | Status: DC | PRN

## 2018-06-20 NOTE — Progress Notes (Signed)
Discharge note:  Patient discharged home per MD order.  Patient received prescriptions and samples of her medications.  Reviewed AVS/transition record with her and indicated understanding.  She denies any thoughts of self harm.  Patient left ambulatory with her grandmother.

## 2018-06-20 NOTE — Progress Notes (Signed)
D:  Patient's self inventory sheet, patient sleeps good, sleep medication helpful.  Good appetite, normal energy level, good concentration.  Denied depression, hopeless and anxiety.  Withdrawals, cravings.  Denied SI.  Denied physical problems.  Denied physical pain.  Goal is discharge.  Plans to behave.  Does have discharge plans. A:  Medications administered per MD orders.  Emotional support and encouragement given patient. R:  Denied SI and HI, contracts for safety.  Denied A/V hallucinations.  Safety maintained with 15 minute checks.
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
## 2018-06-20 NOTE — [facility] Suicide Risk Assessment (Signed)
[facility] Discharge Suicide Risk Assessment   Principal Problem: <principal problem not specified> Discharge Diagnoses:  Patient Active Problem List   Diagnosis Date Noted  . Encounter for IUD insertion [Z30.430] 05/13/2016  . Breakthrough bleeding on Nexplanon [N92.1, Z97.5] 05/12/2016  . High risk sexual behavior [Z72.51] 05/12/2016  . Obesity [E66.9] 05/12/2016  . Acute drug intoxication with delirium (HCC) [Z61.096] 05/10/2014  . PTSD (post-traumatic stress disorder) [F43.10] 05/03/2014  . Moderate recurrent major depression (HCC) [F33.1] 05/03/2014    Total Time spent with patient: 15 minutes  Musculoskeletal: Strength & Muscle Tone: within normal limits Gait & Station: normal Patient leans: N/A  Psychiatric Specialty Exam: Review of Systems  All other systems reviewed and are negative.   Blood pressure 125/88, pulse (!) 59, temperature 98.4 F (36.9 C), temperature source Oral, resp. rate 20, height 5\' 4"  (1.626 m), weight 98.9 kg, SpO2 100 %.Body mass index is 37.42 kg/m.  General Appearance: Casual  Eye Contact::  Fair  Speech:  Normal Rate409  Volume:  Normal  Mood:  Euthymic  Affect:  Congruent  Thought Process:  Coherent and Descriptions of Associations: Intact  Orientation:  Full (Time, Place, and Person)  Thought Content:  Logical  Suicidal Thoughts:  No  Homicidal Thoughts:  No  Memory:  Immediate;   Fair Recent;   Fair Remote;   Fair  Judgement:  Intact  Insight:  Fair  Psychomotor Activity:  Normal  Concentration:  Fair  Recall:  [employer] of Knowledge:Fair  Language: Fair  Akathisia:  Negative  Handed:  Right  AIMS (if indicated):     Assets:  Desire for Improvement Housing Physical Health Resilience Social Support  Sleep:  Number of Hours: 6.5  Cognition: WNL  ADL's:  Intact   Mental Status Per Nursing Assessment::   On Admission:  NA  Demographic Factors:  Adolescent or young adult and Low socioeconomic status  Loss  Factors: NA  Historical Factors: Impulsivity  Risk Reduction Factors:   Sense of responsibility to family, Living with another person, especially a relative and Positive social support  Continued Clinical Symptoms:  Depression:   Impulsivity  Cognitive Features That Contribute To Risk:  None    Suicide Risk:  Minimal: No identifiable suicidal ideation.  Patients presenting with no risk factors but with morbid ruminations; may be classified as minimal risk based on the severity of the depressive symptoms  Follow-up Information    [facility]. Go on 06/22/2018.   Why:  Appointment for medication management and therapy services is Thursday, 06/22/18 at 8:30am. Please be sure to bring your Photo ID, any insurance information, and any discharge paperwork from this hospitalization.  Contact information: [address] [place] [address] [phone]           Plan Of Care/Follow-up recommendations:  Activity:  ad lib  Antonieta Pert, MD 06/20/2018, 9:40 AM

## 2018-06-20 NOTE — Progress Notes (Signed)
  [facility] Adult Case Management Discharge Plan :  Will you be returning to the same living situation after discharge:  Yes,  patient reports she is returning home with her grandmother at discharge At discharge, do you have transportation home?: Yes,  patient reports her grandmother is picking her up at discharge Do you have the ability to pay for your medications: No.  Release of information consent forms completed and in the chart;  Patient's signature needed at discharge.  Patient to Follow up at: Follow-up Information    [facility]. Go on 06/22/2018.   Why:  Appointment for medication management and therapy services is Thursday, 06/22/18 at 8:30am. Please be sure to bring your Photo ID, any insurance information, and any discharge paperwork from this hospitalization.  Contact information: [address] [place] [address] [phone]           Next level of care provider has access to [facility] Link:yes  Safety Planning and Suicide Prevention discussed: Yes,  with the patient     Has patient been referred to the Quitline?: N/A patient is not a smoker  Patient has been referred for addiction treatment: N/A  Maeola Sarah, LCSWA 06/20/2018, 9:54 AM

## 2018-07-20 ENCOUNTER — Ambulatory Visit (HOSPITAL_COMMUNITY)
Admission: EM | Admit: 2018-07-20 | Discharge: 2018-07-20 | Disposition: A | Discharge status: Home / Self Care | Payer: Self-pay | Attending: Family Medicine | Admitting: Family Medicine

## 2018-07-20 ENCOUNTER — Ambulatory Visit (INDEPENDENT_AMBULATORY_CARE_PROVIDER_SITE_OTHER): Payer: Self-pay

## 2018-07-20 ENCOUNTER — Other Ambulatory Visit: Payer: Self-pay

## 2018-07-20 ENCOUNTER — Encounter (HOSPITAL_COMMUNITY): Payer: Self-pay

## 2018-07-20 DIAGNOSIS — S46911A Strain of unspecified muscle, fascia and tendon at shoulder and upper arm level, right arm, initial encounter: Secondary | ICD-10-CM

## 2018-07-20 MED ORDER — NAPROXEN 500 MG PO TABS: 500.0000 mg | ORAL_TABLET | Freq: Two times a day (BID) | ORAL | 0 refills | Status: DC

## 2018-07-20 NOTE — ED Triage Notes (Signed)
Pt cc she states her right shoulder pops in and out of place. Pt states this happened 2 days ago . Now she has right shoulder pain.

## 2018-07-20 NOTE — ED Provider Notes (Signed)
[facility]    CSN: [id number] Arrival date & time: 07/20/18  1414     History   Chief Complaint Chief Complaint  Patient presents with  . Shoulder Pain    HPI Kelsey Collins is a 22 y.o. female.   Patient is a 22 year old female presents today for right shoulder dislocation.  She reports that she was at work today and had her arm extended out and twisted to pick up something and felt her shoulder pop out of place.  Reports that it spontaneously popped back into place on its own but now she is having a lot of pain.  Reports this is happened in the past.  She has not taken anything for pain.  She denies any falls or injuries to the shoulder.  She denies any numbness, tingling, radiation of pain.  ROS per HPI      Past Medical History:  Diagnosis Date  . Anemia   . Anxiety   . Depression   . OCD (obsessive compulsive disorder)     Patient Active Problem List   Diagnosis Date Noted  . 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 Encounter for IUD insertion 05/13/2016  . Breakthrough bleeding on Nexplanon 05/12/2016  . High risk sexual behavior 05/12/2016  . Obesity 05/12/2016  . Acute drug intoxication with delirium (HCC) 05/10/2014  . PTSD (post-traumatic stress disorder) 05/03/2014  . Moderate recurrent major depression (HCC) 05/03/2014    History reviewed. No pertinent surgical history.  OB History    Gravida  1   Para      Term      Preterm      AB  1   Living        SAB  1   TAB      Ectopic      Multiple      Live Births               Home Medications    Prior to Admission medications   Medication Sig Start Date End Date Taking? Authorizing Provider  citalopram (CELEXA) 10 MG tablet Take 1 tablet (10 mg total) by mouth daily. For mood control 06/21/18   Money, Gerlene Burdock, FNP  hydrOXYzine (ATARAX/VISTARIL) 25 MG tablet Take 1 tablet (25 mg total) by mouth every 6 (six) hours as  needed for anxiety. 06/20/18   Money, Gerlene Burdock, FNP  naproxen (NAPROSYN) 500 MG tablet Take 1 tablet (500 mg total) by mouth 2 (two) times daily. 07/20/18   Dahlia Byes A, NP  traZODone (DESYREL) 50 MG tablet Take 1 tablet (50 mg total) by mouth at bedtime as needed for sleep. 06/20/18   Money, Gerlene Burdock, FNP    Family History Family History  Problem Relation Age of Onset  . Cancer Maternal Aunt   . Cancer Maternal Grandmother   . Depression Maternal Grandfather   . Diabetes Maternal Grandfather     Social History Social History   Tobacco Use  . Smoking status: Current Some Day Smoker    Types: Cigarettes  . Smokeless tobacco: Never Used  . Tobacco comment: 1-2 black and milds/everyday  Substance Use Topics  . Alcohol use: Yes    Alcohol/week: 0.0 standard drinks    Comment: socially  . Drug use: Yes    Types: Marijuana    Comment: everyday      Allergies   Dimetapp [albertsons di bromm]; Other; and Pineapple   Review of Systems Review of  Systems   Physical Exam Triage Vital Signs ED Triage Vitals  Enc Vitals Group     BP 07/20/18 1516 109/69     Pulse Rate 07/20/18 1516 63     Resp 07/20/18 1516 16     Temp 07/20/18 1516 98.6 F (37 C)     Temp Source 07/20/18 1516 Oral     SpO2 07/20/18 1516 100 %     Weight 07/20/18 1514 215 lb (97.5 kg)     Height --      Head Circumference --      Peak Flow --      Pain Score 07/20/18 1514 8     Pain Loc --      Pain Edu? --      Excl. in GC? --    No data found.  Updated Vital Signs BP 109/69 (BP Location: Right Arm)   Pulse 63   Temp 98.6 F (37 C) (Oral)   Resp 16   Wt 215 lb (97.5 kg)   SpO2 100%   BMI 36.90 kg/m   Visual Acuity Right Eye Distance:   Left Eye Distance:   Bilateral Distance:    Right Eye Near:   Left Eye Near:    Bilateral Near:     Physical Exam  Constitutional: She appears well-developed and well-nourished.  HENT:  Head: Normocephalic.  Eyes: Conjunctivae are normal.    Neck: Normal range of motion.  Pulmonary/Chest: Effort normal.  Musculoskeletal: She exhibits tenderness. She exhibits no edema or deformity.  Patient has full range of motion of the right  shoulder.  Tender to palpation over anterior shoulder. No deformities or crepitus felt. No erythema or bruising Sensation and distal pulse intact  Neurological: She is alert.  Skin: Skin is warm and dry.  Psychiatric: She has a normal mood and affect.  Nursing note and vitals reviewed.    UC Treatments / Results  Labs (all labs ordered are listed, but only abnormal results are displayed) Labs Reviewed - No data to display  EKG None  Radiology Dg Shoulder Right  Result Date: 07/20/2018 CLINICAL DATA:  History of right shoulder dislocation. EXAM: RIGHT SHOULDER - 2+ VIEW COMPARISON:  None. FINDINGS: The joint spaces are maintained. No acute bony findings or bone lesion. No abnormal soft tissue calcifications. The visualized lung is clear and the visualized ribs are intact. IMPRESSION: Normal right shoulder radiographs. No evidence of fracture or dislocation. Electronically Signed   By: Rudie Meyer M.D.   On: 07/20/2018 15:57    Procedures Procedures (including critical care time)  Medications Ordered in UC Medications - No data to display  Initial Impression / Assessment and Plan / UC Course  I have reviewed the triage vital signs and the nursing notes.  Pertinent labs & imaging results that were available during my care of the patient were reviewed by me and considered in my medical decision making (see chart for details).     X ray normal Most likely soreness from the dislocation Naproxen for pain and inflammation.  Okay to return to work.  Follow up as needed for continued or worsening symptoms  Final Clinical Impressions(s) / UC Diagnoses   Final diagnoses:  Strain of right shoulder, initial encounter     Discharge Instructions     Your x-ray did not show any bony  abnormalities Most likely to soreness from the dislocation. I would be careful with certain movements of the arm to prevent further dislocation You can take naproxen  twice a day with food as needed for pain inflammation Follow up as needed for continued or worsening symptoms     ED Prescriptions    Medication Sig Dispense Auth. Provider   naproxen (NAPROSYN) 500 MG tablet Take 1 tablet (500 mg total) by mouth 2 (two) times daily. 30 tablet Dahlia ByesBast, Danaysia Rader A, NP     Controlled Substance Prescriptions [place] Controlled Substance Registry consulted? Not Applicable   Janace ArisBast, Laurelin Elson A, NP 07/20/18 1615

## 2018-07-20 NOTE — Discharge Instructions (Signed)
Your x-ray did not show any bony abnormalities Most likely to soreness from the dislocation. I would be careful with certain movements of the arm to prevent further dislocation You can take naproxen twice a day with food as needed for pain inflammation Follow up as needed for continued or worsening symptoms

## 2019-03-08 IMAGING — DX DG SHOULDER 2+V*R*
4 series · 4 of 4 positions shown · non-contrast
Comparison: None.

CLINICAL DATA: History of right shoulder dislocation.

EXAM:
RIGHT SHOULDER - 2+ VIEW

[shoulder ap]
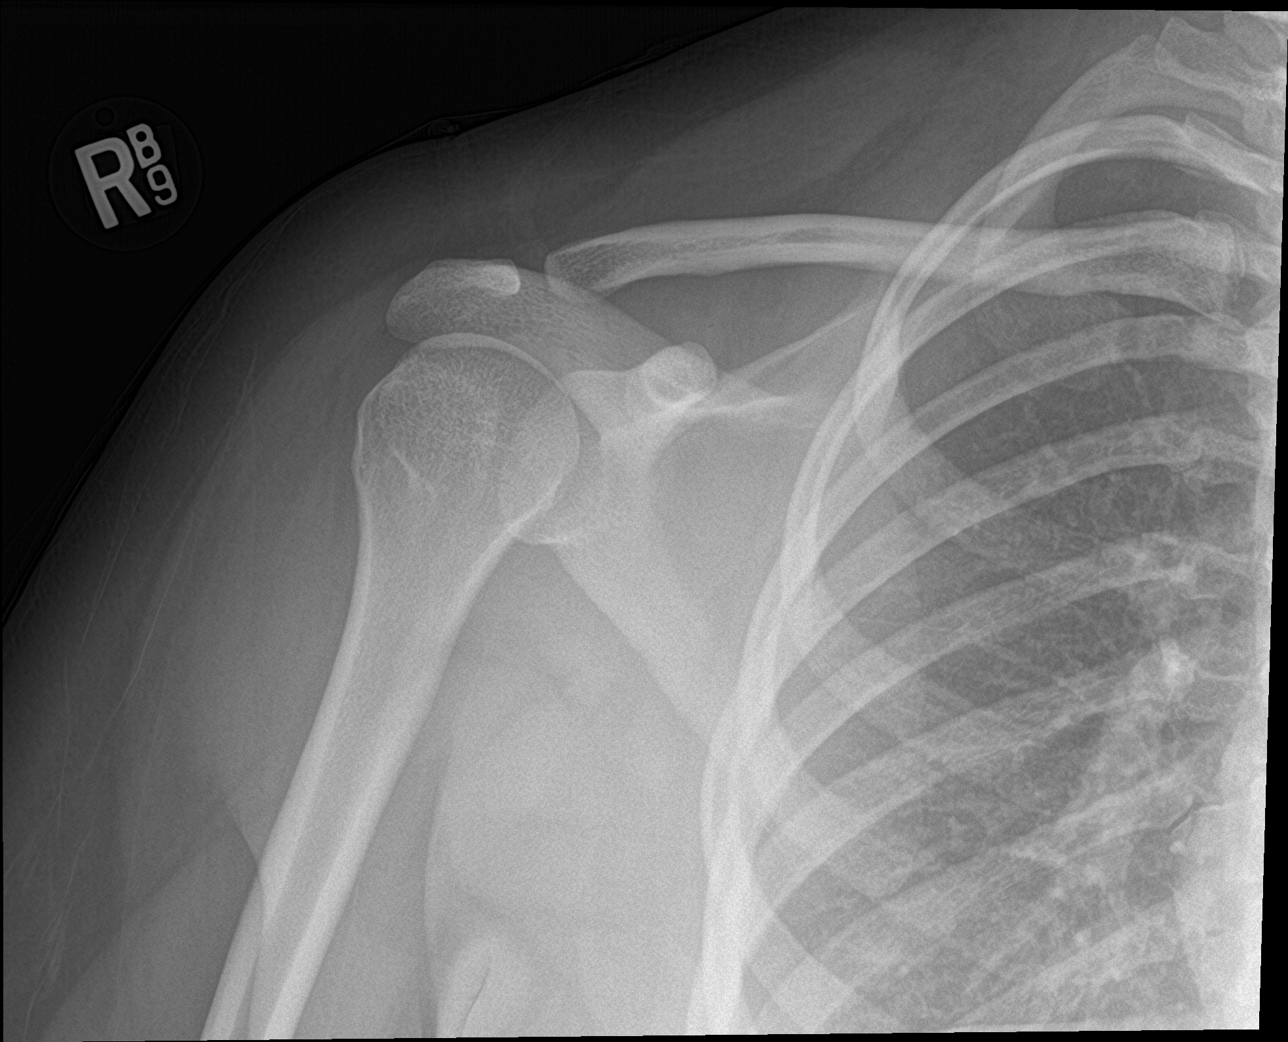

[shoulder grashey]
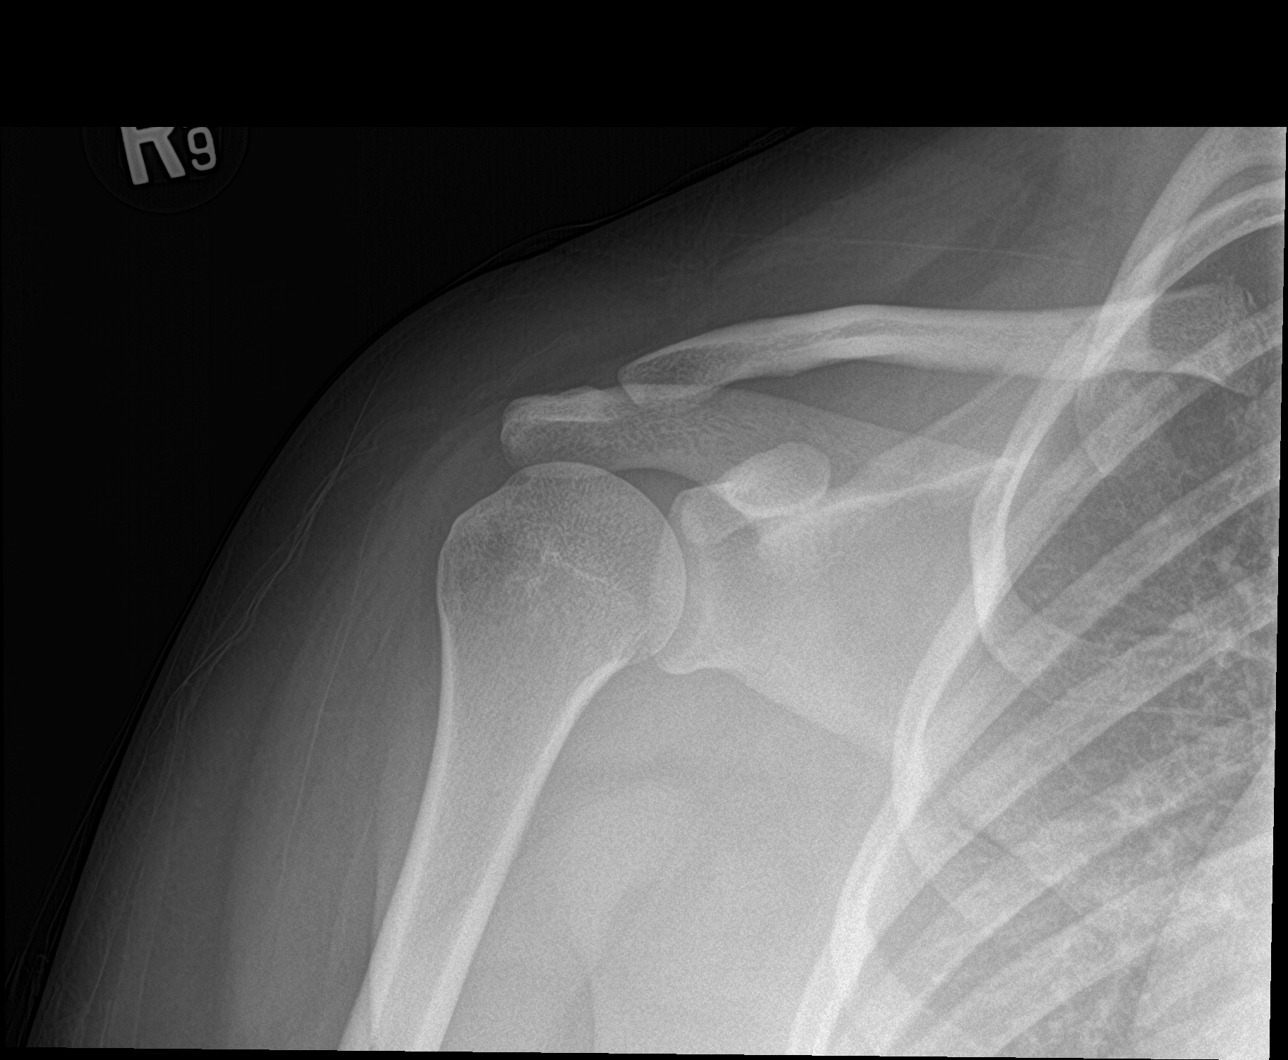

[shoulder y-view]
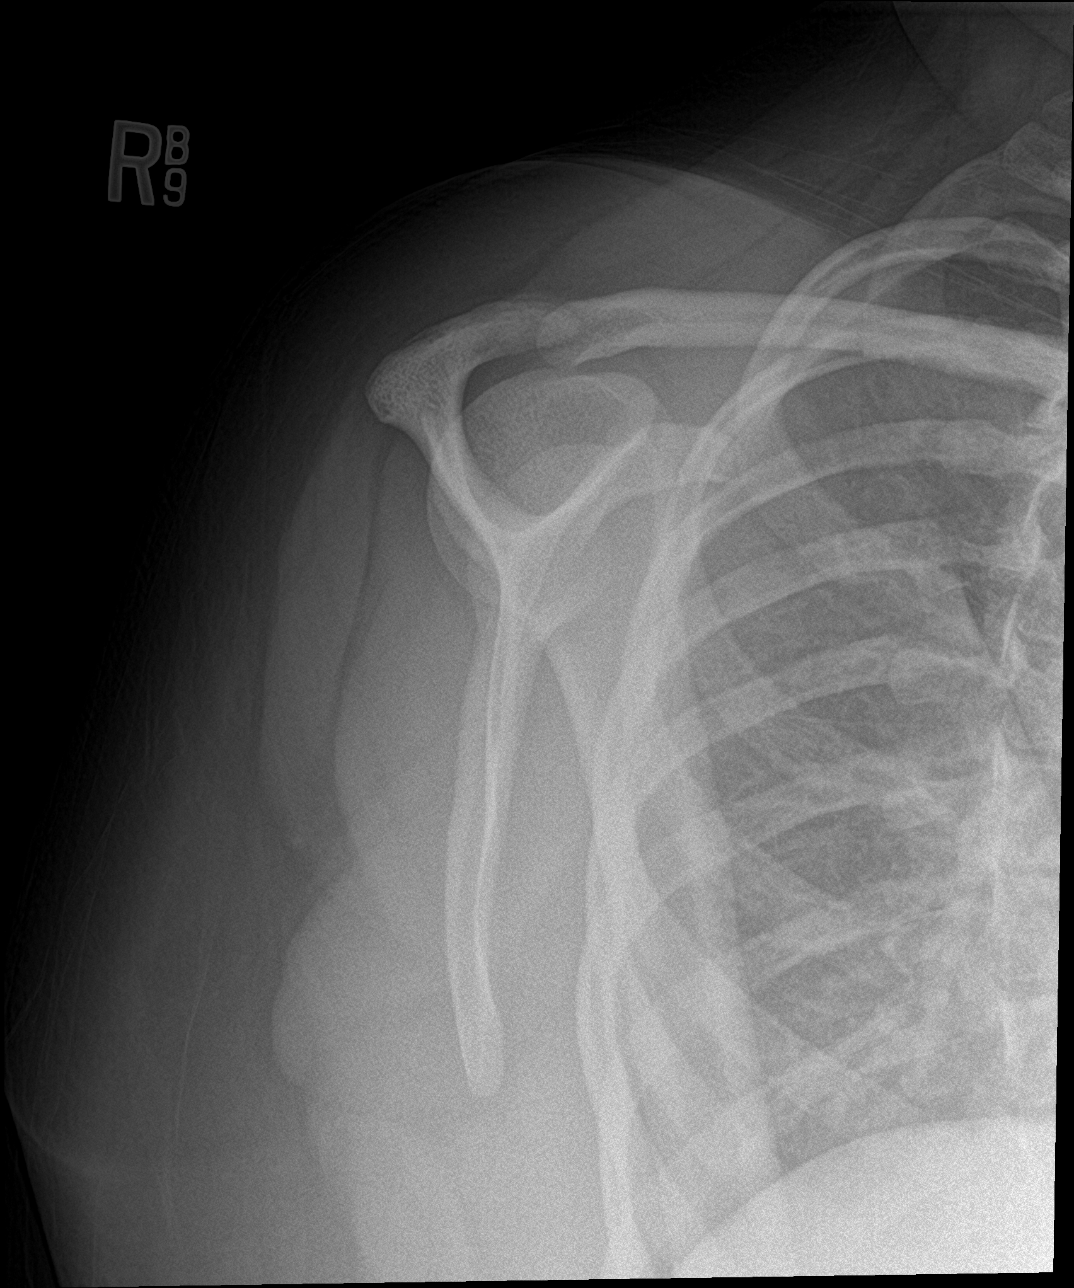

[shoulder axial]
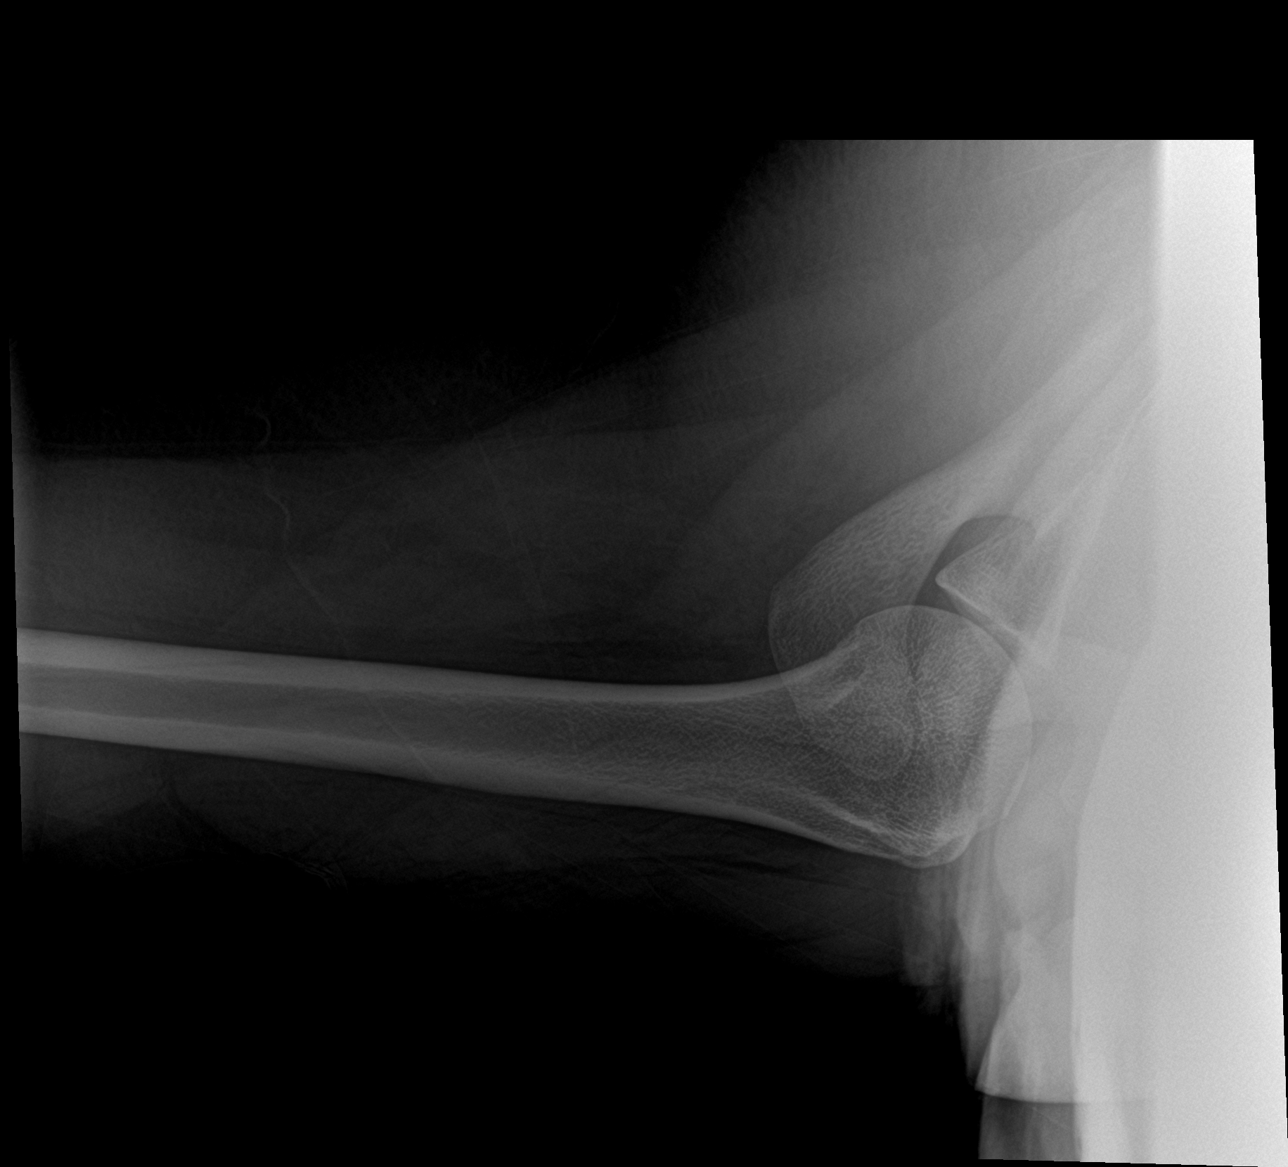

[4 of 4 positions shown; findings below may reference images not displayed]

FINDINGS: The joint spaces are maintained. No acute bony findings or bone
lesion. No abnormal soft tissue calcifications. The visualized lung
is clear and the visualized ribs are intact.
IMPRESSION: Normal right shoulder radiographs. No evidence of fracture or
dislocation.

## 2020-03-20 ENCOUNTER — Emergency Department (HOSPITAL_COMMUNITY)
Admission: EM | Admit: 2020-03-20 | Discharge: 2020-03-20 | Disposition: A | Discharge status: Home / Self Care | Payer: Self-pay | Attending: Emergency Medicine | Admitting: Emergency Medicine

## 2020-03-20 ENCOUNTER — Encounter (HOSPITAL_COMMUNITY): Payer: Self-pay | Admitting: Emergency Medicine

## 2020-03-20 DIAGNOSIS — Z5321 Procedure and treatment not carried out due to patient leaving prior to being seen by health care provider: Secondary | ICD-10-CM | POA: Insufficient documentation

## 2020-03-20 DIAGNOSIS — R112 Nausea with vomiting, unspecified: Secondary | ICD-10-CM | POA: Insufficient documentation

## 2020-03-20 DIAGNOSIS — R197 Diarrhea, unspecified: Secondary | ICD-10-CM | POA: Insufficient documentation

## 2020-03-20 LAB — COMPREHENSIVE METABOLIC PANEL
ALT: 25 U/L (ref 0–44)
AST: 19 U/L (ref 15–41)
Albumin: 4.2 g/dL (ref 3.5–5.0)
Alkaline Phosphatase: 53 U/L (ref 38–126)
Anion gap: 11 (ref 5–15)
BUN: 7 mg/dL (ref 6–20)
CO2: 22 mmol/L (ref 22–32)
Calcium: 10.1 mg/dL (ref 8.9–10.3)
Chloride: 102 mmol/L (ref 98–111)
Creatinine, Ser: 0.86 mg/dL (ref 0.44–1.00)
GFR calc Af Amer: >60 mL/min (ref >60)
GFR calc non Af Amer: >60 mL/min (ref >60)
Glucose, Bld: 118 mg/dL — ABNORMAL HIGH (ref 70–99)
Potassium: 4.3 mmol/L (ref 3.5–5.1)
Sodium: 135 mmol/L (ref 135–145)
Total Bilirubin: 0.7 mg/dL (ref 0.3–1.2)
Total Protein: 7.4 g/dL (ref 6.5–8.1)

## 2020-03-20 LAB — URINALYSIS, ROUTINE W REFLEX MICROSCOPIC
Bacteria, UA: NONE SEEN
Bilirubin Urine: NEGATIVE
Glucose, UA: NEGATIVE mg/dL
Ketones, ur: NEGATIVE mg/dL
Leukocytes,Ua: NEGATIVE
Nitrite: NEGATIVE
Protein, ur: NEGATIVE mg/dL
Specific Gravity, Urine: 1.009 (ref 1.005–1.030)
pH: 6 (ref 5.0–8.0)

## 2020-03-20 LAB — LIPASE, BLOOD: Lipase: 25 U/L (ref 11–51)

## 2020-03-20 LAB — CBC
HCT: 43.9 % (ref 36.0–46.0)
Hemoglobin: 14.3 g/dL (ref 12.0–15.0)
MCH: 28.4 pg (ref 26.0–34.0)
MCHC: 32.6 g/dL (ref 30.0–36.0)
MCV: 87.3 fL (ref 80.0–100.0)
Platelets: 379 10*3/uL (ref 150–400)
RBC: 5.03 MIL/uL (ref 3.87–5.11)
RDW: 13.6 % (ref 11.5–15.5)
WBC: 16.2 10*3/uL — ABNORMAL HIGH (ref 4.0–10.5)
nRBC: 0 % (ref 0.0–0.2)

## 2020-03-20 LAB — I-STAT BETA HCG BLOOD, ED (MC, WL, AP ONLY): I-stat hCG, quantitative: <5 m[IU]/mL (ref <5)

## 2020-03-20 MED ORDER — SODIUM CHLORIDE 0.9% FLUSH: 3.0000 mL | Freq: Once | INTRAVENOUS | Status: DC

## 2020-03-20 NOTE — ED Triage Notes (Signed)
Pt states abd pain, n/v/d since yesterday.

## 2020-03-20 NOTE — ED Notes (Signed)
No answer for VS x1 

## 2020-07-14 ENCOUNTER — Ambulatory Visit (HOSPITAL_COMMUNITY)
Admission: EM | Admit: 2020-07-14 | Discharge: 2020-07-14 | Disposition: A | Discharge status: Home / Self Care | Payer: Self-pay | Attending: Internal Medicine | Admitting: Internal Medicine

## 2020-07-14 ENCOUNTER — Encounter (HOSPITAL_COMMUNITY): Payer: Self-pay | Admitting: *Deleted

## 2020-07-14 ENCOUNTER — Other Ambulatory Visit: Payer: Self-pay

## 2020-07-14 DIAGNOSIS — Z3202 Encounter for pregnancy test, result negative: Secondary | ICD-10-CM

## 2020-07-14 DIAGNOSIS — R102 Pelvic and perineal pain: Secondary | ICD-10-CM | POA: Insufficient documentation

## 2020-07-14 DIAGNOSIS — M79605 Pain in left leg: Secondary | ICD-10-CM | POA: Insufficient documentation

## 2020-07-14 LAB — HIV ANTIBODY (ROUTINE TESTING W REFLEX): HIV Screen 4th Generation wRfx: NONREACTIVE

## 2020-07-14 LAB — POC URINE PREG, ED: Preg Test, Ur: NEGATIVE

## 2020-07-14 MED ORDER — NAPROXEN 500 MG PO TABS: 500.0000 mg | ORAL_TABLET | Freq: Two times a day (BID) | ORAL | 0 refills | Status: AC | PRN

## 2020-07-14 MED ORDER — PREDNISONE 20 MG PO TABS: 40.0000 mg | ORAL_TABLET | Freq: Every day | ORAL | 0 refills | Status: AC

## 2020-07-14 NOTE — ED Provider Notes (Signed)
[facility]    CSN: [id number] Arrival date & time: 07/14/20  1041      History   Chief Complaint Chief Complaint  Patient presents with  . Insect Bite    one month ago   . Exposure to STD    HPI Kelsey Collins is a 24 y.o. female.   Patient presenting today with several complaints. States she's been having left anterior lower leg pain, lateral ankle pain and swelling, and sometimes pain radiating up toward shin and knee on that side for about a month now. She did have an insect bite toward the ankle around the time of onset but otherwise no known injury to the area she is aware of. Denies redness, heat, calf pain or swelling, CP, SOB, palpitations, hx of coagulopathy/long travel/recent surgeries. On IUD for contraception.  Also having pelvic pain off and on the past month or so and states the pressure feels like where her IUD sits and she is concerned it may have shifted. She is due for removal next year on the IUD. Denies vaginal discharge, irritation, dysuria, hematuria. Does want STI testing just to be sure.      Past Medical History:  Diagnosis Date  . Anemia   . Anxiety   . Depression   . OCD (obsessive compulsive disorder)     Patient Active Problem List   Diagnosis Date Noted  . 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 Encounter for IUD insertion 05/13/2016  . Breakthrough bleeding on Nexplanon 05/12/2016  . High risk sexual behavior 05/12/2016  . Obesity 05/12/2016  . Acute drug intoxication with delirium (HCC) 05/10/2014  . PTSD (post-traumatic stress disorder) 05/03/2014  . Moderate recurrent major depression (HCC) 05/03/2014    History reviewed. No pertinent surgical history.  OB History    Gravida  1   Para      Term      Preterm      AB  1   Living        SAB  1   TAB      Ectopic      Multiple      Live Births               Home Medications    Prior to Admission  medications   Medication Sig Start Date End Date Taking? Authorizing Provider  citalopram (CELEXA) 10 MG tablet Take 1 tablet (10 mg total) by mouth daily. For mood control 06/21/18   Money, Gerlene Burdock, FNP  hydrOXYzine (ATARAX/VISTARIL) 25 MG tablet Take 1 tablet (25 mg total) by mouth every 6 (six) hours as needed for anxiety. 06/20/18   Money, Gerlene Burdock, FNP  naproxen (NAPROSYN) 500 MG tablet Take 1 tablet (500 mg total) by mouth 2 (two) times daily as needed. 07/14/20   Particia Nearing, PA-C  predniSONE (DELTASONE) 20 MG tablet Take 2 tablets (40 mg total) by mouth daily with breakfast. 07/14/20   Particia Nearing, PA-C  traZODone (DESYREL) 50 MG tablet Take 1 tablet (50 mg total) by mouth at bedtime as needed for sleep. 06/20/18   Money, Gerlene Burdock, FNP    Family History Family History  Problem Relation Age of Onset  . Cancer Maternal Aunt   . Cancer Maternal Grandmother   . Depression Maternal Grandfather   . Diabetes Maternal Grandfather     Social History Social History   Tobacco Use  . Smoking status: Current Some Day Smoker  Types: Cigarettes  . Smokeless tobacco: Never Used  . Tobacco comment: 1-2 black and milds/everyday  Vaping Use  . Vaping Use: Never used  Substance Use Topics  . Alcohol use: Yes    Alcohol/week: 0.0 standard drinks    Comment: socially  . Drug use: Yes    Types: Marijuana    Comment: everyday      Allergies   Dimetapp [albertsons di bromm], Other, and Pineapple   Review of Systems Review of Systems PER HPI    Physical Exam Triage Vital Signs ED Triage Vitals  Enc Vitals Group     BP 07/14/20 1057 119/77     Pulse Rate 07/14/20 1057 72     Resp 07/14/20 1057 16     Temp 07/14/20 1057 97.7 F (36.5 C)     Temp Source 07/14/20 1057 Oral     SpO2 07/14/20 1057 100 %     Weight 07/14/20 1101 215 lb (97.5 kg)     Height 07/14/20 1101 5\' 4"  (1.626 m)     Head Circumference --      Peak Flow --      Pain Score 07/14/20  1100 7     Pain Loc --      Pain Edu? --      Excl. in GC? --    No data found.  Updated Vital Signs BP 119/77 (BP Location: Right Arm)   Pulse 72   Temp 97.7 F (36.5 C) (Oral)   Resp 16   Ht 5\' 4"  (1.626 m)   Wt 215 lb (97.5 kg)   LMP 06/28/2020   SpO2 100%   BMI 36.90 kg/m   Visual Acuity Right Eye Distance:   Left Eye Distance:   Bilateral Distance:    Right Eye Near:   Left Eye Near:    Bilateral Near:     Physical Exam Vitals and nursing note reviewed. Exam conducted with a chaperone present.  Constitutional:      Appearance: Normal appearance. She is not ill-appearing.  HENT:     Head: Atraumatic.  Eyes:     Extraocular Movements: Extraocular movements intact.     Conjunctiva/sclera: Conjunctivae normal.  Cardiovascular:     Rate and Rhythm: Normal rate and regular rhythm.     Heart sounds: Normal heart sounds.  Pulmonary:     Effort: Pulmonary effort is normal.     Breath sounds: Normal breath sounds.  Abdominal:     General: Bowel sounds are normal. There is no distension.     Palpations: Abdomen is soft.     Tenderness: There is no abdominal tenderness. There is no guarding.  Genitourinary:    Comments: Vaginal mucosa benign appearing, moderate amount of mucus and thin white discharge present. IUD strings visible and appear in proper position Musculoskeletal:        General: Normal range of motion.     Cervical back: Normal range of motion and neck supple.     Comments: Good ROM left foot, toes, and knee without pain Neg homans sign, squeeze test. No ttp posterior calf or knee  Skin:    General: Skin is warm and dry.     Findings: No bruising, erythema or lesion.     Comments: Small soft tissue mass anterior left lower leg toward medial edge, mildly ttp and she states her radiating pain comes from that location and travels up toward knee. Left lateral ankle edema around malleolus   Neurological:  Mental Status: She is alert and oriented to  person, place, and time.  Psychiatric:        Mood and Affect: Mood normal.        Thought Content: Thought content normal.        Judgment: Judgment normal.     UC Treatments / Results  Labs (all labs ordered are listed, but only abnormal results are displayed) Labs Reviewed  RPR  HIV ANTIBODY (ROUTINE TESTING W REFLEX)  POC URINE PREG, ED  CERVICOVAGINAL ANCILLARY ONLY    EKG   Radiology No results found.  Procedures Procedures (including critical care time)  Medications Ordered in UC Medications - No data to display  Initial Impression / Assessment and Plan / UC Course  I have reviewed the triage vital signs and the nursing notes.  Pertinent labs & imaging results that were available during my care of the patient were reviewed by me and considered in my medical decision making (see chart for details).     Mass possibly representing lipoma vs cyst, low suspicion for DVT given location and sxs. Discussed she would benefit from a soft tissue u/s of this area with PCP if not resolving with anti inflammatory medications and heat. Ankle swelling may represent a mild lateral ankle strain. RICE reviewed for this. Return precautions reviewed for worsening sxs at any time.  Pelvic exam reassuring aside from moderate discharge, aptima swab and STI labs pending. Will treat based on results. Discussed f/u with GYN for further eval if results all neg and pain continues. Urine preg was negative, pt requested test.   Final Clinical Impressions(s) / UC Diagnoses   Final diagnoses:  Left leg pain  Pelvic pain   Discharge Instructions   None    ED Prescriptions    Medication Sig Dispense Auth. Provider   predniSONE (DELTASONE) 20 MG tablet Take 2 tablets (40 mg total) by mouth daily with breakfast. 10 tablet Particia Nearing, PA-C   naproxen (NAPROSYN) 500 MG tablet Take 1 tablet (500 mg total) by mouth 2 (two) times daily as needed. 30 tablet Particia Nearing, [place]       PDMP not reviewed this encounter.   Particia Nearing, [place] 07/14/20 1144

## 2020-07-14 NOTE — ED Triage Notes (Signed)
Pt presents with swelling to medial lower Lt leg that rdaites up leg. Pt thinks she had a insect bite one month ago to same area. Pt also wants STD check and Pregnancy test.

## 2020-07-15 LAB — RPR: RPR Ser Ql: NONREACTIVE

## 2020-07-15 LAB — CERVICOVAGINAL ANCILLARY ONLY
Bacterial Vaginitis (gardnerella): POSITIVE — AB
Candida Glabrata: NEGATIVE
Candida Vaginitis: NEGATIVE
Chlamydia: NEGATIVE
Comment: NEGATIVE
Comment: NEGATIVE
Comment: NEGATIVE
Comment: NEGATIVE
Comment: NEGATIVE
Comment: NORMAL
Neisseria Gonorrhea: NEGATIVE
Trichomonas: POSITIVE — AB

## 2020-07-16 ENCOUNTER — Telehealth (HOSPITAL_COMMUNITY): Payer: Self-pay | Admitting: Emergency Medicine

## 2020-07-16 MED ORDER — METRONIDAZOLE 500 MG PO TABS: 500.0000 mg | ORAL_TABLET | Freq: Two times a day (BID) | ORAL | 0 refills | Status: DC

## 2020-07-30 ENCOUNTER — Encounter (HOSPITAL_COMMUNITY): Payer: Self-pay | Admitting: *Deleted

## 2020-07-30 ENCOUNTER — Other Ambulatory Visit: Payer: Self-pay

## 2020-07-30 ENCOUNTER — Emergency Department (HOSPITAL_COMMUNITY)
Admission: EM | Admit: 2020-07-30 | Discharge: 2020-07-30 | Disposition: A | Discharge status: Home / Self Care | Payer: Self-pay | Attending: Emergency Medicine | Admitting: Emergency Medicine

## 2020-07-30 ENCOUNTER — Emergency Department (HOSPITAL_COMMUNITY): Payer: Self-pay

## 2020-07-30 ENCOUNTER — Emergency Department (HOSPITAL_BASED_OUTPATIENT_CLINIC_OR_DEPARTMENT_OTHER): Payer: Self-pay

## 2020-07-30 DIAGNOSIS — I80292 Phlebitis and thrombophlebitis of other deep vessels of left lower extremity: Secondary | ICD-10-CM | POA: Insufficient documentation

## 2020-07-30 DIAGNOSIS — M25572 Pain in left ankle and joints of left foot: Secondary | ICD-10-CM

## 2020-07-30 DIAGNOSIS — Z79899 Other long term (current) drug therapy: Secondary | ICD-10-CM | POA: Insufficient documentation

## 2020-07-30 DIAGNOSIS — I809 Phlebitis and thrombophlebitis of unspecified site: Secondary | ICD-10-CM

## 2020-07-30 DIAGNOSIS — M79605 Pain in left leg: Secondary | ICD-10-CM

## 2020-07-30 DIAGNOSIS — F17210 Nicotine dependence, cigarettes, uncomplicated: Secondary | ICD-10-CM | POA: Insufficient documentation

## 2020-07-30 DIAGNOSIS — M7989 Other specified soft tissue disorders: Secondary | ICD-10-CM

## 2020-07-30 MED ORDER — KETOROLAC TROMETHAMINE 15 MG/ML IJ SOLN
15.0000 mg | Freq: Once | INTRAMUSCULAR | Status: AC
  Administered 2020-07-30: 15 mg via INTRAMUSCULAR
  Filled 2020-07-30: qty 1

## 2020-07-30 MED ORDER — ACETAMINOPHEN 500 MG PO TABS
1000.0000 mg | ORAL_TABLET | Freq: Once | ORAL | Status: AC
  Administered 2020-07-30: 1000 mg via ORAL
  Filled 2020-07-30: qty 2

## 2020-07-30 NOTE — ED Triage Notes (Signed)
Pt reports left ankle swelling x 2 months.  Pt reports going to UC and getting a prescription for steroids two weeks ago but that has not helped.  Pt denies any other symptoms.  Legs feel like the same temperature in triage.  Pt a/o x and ambulatory.

## 2020-07-30 NOTE — ED Provider Notes (Signed)
[facility]-EMERGENCY DEPT Provider Note   CSN: [id number] Arrival date & time: 07/30/20  7062     History Chief Complaint  Patient presents with  . Leg Swelling    Kelsey Collins is a 24 y.o. female.  24 yo F with a chief complaints of left lower extremity swelling going on for months now.  Initially she thought she might have been bitten by an insect.  Gone urgent care and started her on steroids without significant problems.  Swelling coming ago.  Denies chest pain or shortness of breath..  The history is provided by the patient.  Illness Severity:  Moderate Onset quality:  Gradual Timing:  Constant Progression:  Worsening Chronicity:  New Associated symptoms: myalgias   Associated symptoms: no chest pain, no congestion, no fever, no headaches, no nausea, no rhinorrhea, no shortness of breath, no vomiting and no wheezing        Past Medical History:  Diagnosis Date  . Anemia   . Anxiety   . Depression   . OCD (obsessive compulsive disorder)     Patient Active Problem List   Diagnosis Date Noted  . 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 Encounter for IUD insertion 05/13/2016  . Breakthrough bleeding on Nexplanon 05/12/2016  . High risk sexual behavior 05/12/2016  . Obesity 05/12/2016  . Acute drug intoxication with delirium (HCC) 05/10/2014  . PTSD (post-traumatic stress disorder) 05/03/2014  . Moderate recurrent major depression (HCC) 05/03/2014    History reviewed. No pertinent surgical history.   OB History    Gravida  1   Para      Term      Preterm      AB  1   Living        SAB  1   TAB      Ectopic      Multiple      Live Births              Family History  Problem Relation Age of Onset  . Cancer Maternal Aunt   . Cancer Maternal Grandmother   . Depression Maternal Grandfather   . Diabetes Maternal Grandfather     Social History   Tobacco Use  .  Smoking status: Current Some Day Smoker    Types: Cigarettes  . Smokeless tobacco: Never Used  . Tobacco comment: 1-2 black and milds/everyday  Vaping Use  . Vaping Use: Never used  Substance Use Topics  . Alcohol use: Yes    Alcohol/week: 0.0 standard drinks    Comment: socially  . Drug use: Yes    Types: Marijuana    Comment: everyday     Home Medications Prior to Admission medications   Medication Sig Start Date End Date Taking? Authorizing Provider  acetaminophen (TYLENOL) 325 MG tablet Take 650 mg by mouth every 6 (six) hours as needed for mild pain, fever or headache.   Yes [provider]  citalopram (CELEXA) 10 MG tablet Take 1 tablet (10 mg total) by mouth daily. For mood control Patient not taking: Reported on 07/30/2020 06/21/18   Money, Gerlene Burdock, FNP  metroNIDAZOLE (FLAGYL) 500 MG tablet Take 1 tablet (500 mg total) by mouth 2 (two) times daily. 07/16/20   Lamptey, Britta Mccreedy, MD  naproxen (NAPROSYN) 500 MG tablet Take 1 tablet (500 mg total) by mouth 2 (two) times daily as needed. Patient taking differently: Take 500 mg by mouth 2 (two) times daily as  needed for mild pain.  07/14/20   Particia NearingLane, Rachel Elizabeth, PA-C  predniSONE (DELTASONE) 20 MG tablet Take 2 tablets (40 mg total) by mouth daily with breakfast. 07/14/20   Particia NearingLane, Rachel Elizabeth, PA-C    Allergies    Dimetapp [albertsons di bromm], Other, and Pineapple  Review of Systems   Review of Systems  Constitutional: Negative for chills and fever.  HENT: Negative for congestion and rhinorrhea.   Eyes: Negative for redness and visual disturbance.  Respiratory: Negative for shortness of breath and wheezing.   Cardiovascular: Negative for chest pain and palpitations.  Gastrointestinal: Negative for nausea and vomiting.  Genitourinary: Negative for dysuria and urgency.  Musculoskeletal: Positive for arthralgias and myalgias.  Skin: Negative for pallor and wound.  Neurological: Negative for dizziness and  headaches.    Physical Exam Updated Vital Signs BP 114/81 (BP Location: Right Arm)   Pulse 69   Temp 98.4 F (36.9 C) (Oral)   Resp 16   Ht 5\' 4"  (1.626 m)   Wt 97.5 kg   LMP 06/28/2020   SpO2 100%   BMI 36.90 kg/m   Physical Exam Vitals and nursing note reviewed.  Constitutional:      General: She is not in acute distress.    Appearance: She is well-developed. She is not diaphoretic.  HENT:     Head: Normocephalic and atraumatic.  Eyes:     Pupils: Pupils are equal, round, and reactive to light.  Cardiovascular:     Rate and Rhythm: Normal rate and regular rhythm.     Heart sounds: No murmur heard.  No friction rub. No gallop.   Pulmonary:     Effort: Pulmonary effort is normal.     Breath sounds: No wheezing or rales.  Abdominal:     General: There is no distension.     Palpations: Abdomen is soft.     Tenderness: There is no abdominal tenderness.  Musculoskeletal:        General: Swelling present. No tenderness.     Cervical back: Normal range of motion and neck supple.     Comments: 1+ edema worse on the left than the right.  Pulse motor and sensation intact distally.  Swelling seems more around the ankle than the rest of the leg.  No pain at the fibular neck.  Skin:    General: Skin is warm and dry.  Neurological:     Mental Status: She is alert and oriented to person, place, and time.  Psychiatric:        Behavior: Behavior normal.     ED Results / Procedures / Treatments   Labs (all labs ordered are listed, but only abnormal results are displayed) Labs Reviewed - No data to display  EKG None  Radiology DG Ankle Complete Left  Result Date: 07/30/2020 CLINICAL DATA:  Pain EXAM: LEFT ANKLE COMPLETE - 3+ VIEW COMPARISON:  None. FINDINGS: Frontal, oblique, and lateral views were obtained. No evident fracture or joint effusion. No joint space narrowing or erosion. Ankle mortise appears intact. IMPRESSION: No fracture or evident arthropathy.  Ankle mortise  appears intact. Electronically Signed   By: Bretta BangWilliam  Woodruff III M.D.   On: 07/30/2020 09:36   VAS US LOWER EXTREMITY VENOUS (DVT) (ONLY [facility] & [facility])  Result Date: 07/30/2020  Lower Venous DVT Study Indications: Localized pain and swelling x2 months at distal medial calf/ankle.  Limitations: Poor ultrasound/tissue interface. Comparison Study: No prior study Performing Technologist: Gertie FeyMichelle Simonetti MHA, RDMS, RVT, RDCS  Examination  Guidelines: A complete evaluation includes B-mode imaging, spectral Doppler, color Doppler, and power Doppler as needed of all accessible portions of each vessel. Bilateral testing is considered an integral part of a complete examination. Limited examinations for reoccurring indications may be performed as noted. The reflux portion of the exam is performed with the patient in reverse Trendelenburg.  +-----+---------------+---------+-----------+----------+--------------+ RIGHTCompressibilityPhasicitySpontaneityPropertiesThrombus Aging +-----+---------------+---------+-----------+----------+--------------+ CFV  Full           Yes      Yes                                 +-----+---------------+---------+-----------+----------+--------------+   +-----------+---------------+---------+-----------+----------------+-----------+ LEFT       CompressibilityPhasicitySpontaneityProperties      Thrombus                                                                  Aging       +-----------+---------------+---------+-----------+----------------+-----------+ CFV        Full           Yes      Yes                                    +-----------+---------------+---------+-----------+----------------+-----------+ SFJ        Full                                                           +-----------+---------------+---------+-----------+----------------+-----------+ FV Prox    Full                                                            +-----------+---------------+---------+-----------+----------------+-----------+ FV Mid     Full                                                           +-----------+---------------+---------+-----------+----------------+-----------+ FV Distal  Full                                                           +-----------+---------------+---------+-----------+----------------+-----------+ PFV        Full                                                           +-----------+---------------+---------+-----------+----------------+-----------+ POP  Full           Yes      Yes                                    +-----------+---------------+---------+-----------+----------------+-----------+ PTV        Full                                                           +-----------+---------------+---------+-----------+----------------+-----------+ PERO       Full                                                           +-----------+---------------+---------+-----------+----------------+-----------+ Varicose   Full                               Circumferential             vein of                                       heterogeneity               distal                                                                    medial calf                                                               +-----------+---------------+---------+-----------+----------------+-----------+    Summary: RIGHT: - No evidence of common femoral vein obstruction.  LEFT: - There is no evidence of deep vein thrombosis in the lower extremity. There is a varicose vein of the distal medial calf with no evidence of thrombus and circumferential heterogeneity, suggestive of possible inflammation/phlebitis.  - No cystic structure found in the popliteal fossa.  *See table(s) above for measurements and observations.    Preliminary     Procedures Procedures (including critical care  time)  Medications Ordered in ED Medications  acetaminophen (TYLENOL) tablet 1,000 mg (1,000 mg Oral Given 07/30/20 0857)  ketorolac (TORADOL) 15 MG/ML injection 15 mg (15 mg Intramuscular Given 07/30/20 0347)    ED Course  I have reviewed the triage vital signs and the nursing notes.  Pertinent labs & imaging results that were available during my care of the patient were reviewed by me and considered in my medical decision making (see chart for details).    MDM Rules/Calculators/A&P  24 yo F with a chief complaints of left leg pain and swelling.  Going on for a couple months.  Patient likely has a ankle sprain based on history and exam though with diffuse leg swelling and going on for a couple months will obtain a DVT study.  X-ray to rule out occult fracture she has not had imaging of this since onset.  Plain film of the ankle negative for fracture is viewed by me.  Patient's ultrasound is negative for DVT however there was some concern for thrombophlebitis around the varicose vein in the area of her discomfort.  I will immobilize the ankle with a lace up brace crutches have her follow-up with her family doctor in the office.  10:47 AM:  I have discussed the diagnosis/risks/treatment options with the patient and believe the pt to be eligible for discharge home to follow-up with PCP. We also discussed returning to the ED immediately if new or worsening sx occur. We discussed the sx which are most concerning (e.g., sudden worsening pain, fever, inability to tolerate by mouth) that necessitate immediate return. Medications administered to the patient during their visit and any new prescriptions provided to the patient are listed below.  Medications given during this visit Medications  acetaminophen (TYLENOL) tablet 1,000 mg (1,000 mg Oral Given 07/30/20 0857)  ketorolac (TORADOL) 15 MG/ML injection 15 mg (15 mg Intramuscular Given 07/30/20 0858)     The patient  appears reasonably screen and/or stabilized for discharge and I doubt any other medical condition or other [facility] requiring further screening, evaluation, or treatment in the ED at this time prior to discharge.   Final Clinical Impression(s) / ED Diagnoses Final diagnoses:  Thrombophlebitis  Acute left ankle pain    Rx / DC Orders ED Discharge Orders    None       Melene Plan, DO 07/30/20 1047

## 2020-07-30 NOTE — ED Notes (Signed)
Ortho called to apply ASO ankle and give pt crutches.

## 2020-07-30 NOTE — Discharge Instructions (Signed)
Take 4 over the counter ibuprofen tablets 3 times a day or 2 over-the-counter naproxen tablets twice a day for pain. Also take tylenol 1000mg(2 extra strength) four times a day.    

## 2020-07-30 NOTE — Progress Notes (Signed)
Left lower extremity venous duplex completed. Refer to "CV Proc" under chart review to view preliminary results.  Preliminary results discussed with Dr. Adela Lank.  07/30/2020 10:05 AM Eula Fried., MHA, RVT, RDCS, RDMS

## 2020-08-08 ENCOUNTER — Other Ambulatory Visit: Payer: Self-pay

## 2020-08-08 ENCOUNTER — Emergency Department (HOSPITAL_COMMUNITY)
Admission: EM | Admit: 2020-08-08 | Discharge: 2020-08-08 | Disposition: A | Discharge status: Home / Self Care | Payer: Self-pay | Attending: Emergency Medicine | Admitting: Emergency Medicine

## 2020-08-08 ENCOUNTER — Emergency Department (HOSPITAL_COMMUNITY): Payer: Self-pay

## 2020-08-08 ENCOUNTER — Encounter (HOSPITAL_COMMUNITY): Payer: Self-pay

## 2020-08-08 DIAGNOSIS — M79662 Pain in left lower leg: Secondary | ICD-10-CM | POA: Insufficient documentation

## 2020-08-08 DIAGNOSIS — E871 Hypo-osmolality and hyponatremia: Secondary | ICD-10-CM | POA: Insufficient documentation

## 2020-08-08 DIAGNOSIS — I809 Phlebitis and thrombophlebitis of unspecified site: Secondary | ICD-10-CM

## 2020-08-08 DIAGNOSIS — M79605 Pain in left leg: Secondary | ICD-10-CM

## 2020-08-08 DIAGNOSIS — M7989 Other specified soft tissue disorders: Secondary | ICD-10-CM

## 2020-08-08 DIAGNOSIS — F17210 Nicotine dependence, cigarettes, uncomplicated: Secondary | ICD-10-CM | POA: Insufficient documentation

## 2020-08-08 DIAGNOSIS — R2242 Localized swelling, mass and lump, left lower limb: Secondary | ICD-10-CM | POA: Insufficient documentation

## 2020-08-08 DIAGNOSIS — D72829 Elevated white blood cell count, unspecified: Secondary | ICD-10-CM | POA: Insufficient documentation

## 2020-08-08 LAB — CBC
HCT: 40.9 % (ref 36.0–46.0)
Hemoglobin: 13.4 g/dL (ref 12.0–15.0)
MCH: 28.6 pg (ref 26.0–34.0)
MCHC: 32.8 g/dL (ref 30.0–36.0)
MCV: 87.2 fL (ref 80.0–100.0)
Platelets: 379 10*3/uL (ref 150–400)
RBC: 4.69 MIL/uL (ref 3.87–5.11)
RDW: 14 % (ref 11.5–15.5)
WBC: 12.5 10*3/uL — ABNORMAL HIGH (ref 4.0–10.5)
nRBC: 0 % (ref 0.0–0.2)

## 2020-08-08 LAB — I-STAT BETA HCG BLOOD, ED (MC, WL, AP ONLY): I-stat hCG, quantitative: <5 m[IU]/mL (ref <5)

## 2020-08-08 LAB — BASIC METABOLIC PANEL
Anion gap: 11 (ref 5–15)
BUN: 10 mg/dL (ref 6–20)
CO2: 21 mmol/L — ABNORMAL LOW (ref 22–32)
Calcium: 9.6 mg/dL (ref 8.9–10.3)
Chloride: 102 mmol/L (ref 98–111)
Creatinine, Ser: 0.84 mg/dL (ref 0.44–1.00)
GFR, Estimated: >60 mL/min (ref >60)
Glucose, Bld: 82 mg/dL (ref 70–99)
Potassium: 3.6 mmol/L (ref 3.5–5.1)
Sodium: 134 mmol/L — ABNORMAL LOW (ref 135–145)

## 2020-08-08 LAB — TROPONIN I (HIGH SENSITIVITY): Troponin I (High Sensitivity): <2 ng/L (ref <18)

## 2020-08-08 MED ORDER — ASPIRIN 81 MG PO CHEW: 324.0000 mg | CHEWABLE_TABLET | Freq: Every day | ORAL | 0 refills | Status: AC

## 2020-08-08 MED ORDER — CLINDAMYCIN HCL 150 MG PO CAPS: 300.0000 mg | ORAL_CAPSULE | Freq: Three times a day (TID) | ORAL | 0 refills | Status: AC

## 2020-08-08 MED ORDER — CLINDAMYCIN PHOSPHATE 600 MG/50ML IV SOLN
600.0000 mg | Freq: Once | INTRAVENOUS | Status: AC
  Administered 2020-08-08: 600 mg via INTRAVENOUS
  Filled 2020-08-08: qty 50

## 2020-08-08 MED ORDER — MORPHINE SULFATE (PF) 2 MG/ML IV SOLN
2.0000 mg | Freq: Once | INTRAVENOUS | Status: AC
  Administered 2020-08-08: 2 mg via INTRAVENOUS
  Filled 2020-08-08: qty 1

## 2020-08-08 NOTE — ED Triage Notes (Signed)
Patient c/o left leg pain x 3-4 days. Patient also reports that she is having near syncopal feelings, chest pain, and SOB from the leg pain being so painful.

## 2020-08-08 NOTE — ED Notes (Signed)
DC after IV abx

## 2020-08-08 NOTE — Progress Notes (Signed)
Lower extremity venous has been completed.   Preliminary results in CV Proc.   Blanch Media 08/08/2020 5:27 PM

## 2020-08-08 NOTE — Discharge Instructions (Addendum)
Like we discussed, I am prescribing you 2 medications.  The first medication is aspirin. In addition to continuing to use warm compresses and taking Naprosyn, I would recommend you begin taking 324 mg of aspirin per day.  Additionally, there is a possibility you are developing a skin infection. I am prescribing you clindamycin for this. This is the same medication you received in your IV while in the emergency department. You need to take this 3 times a day for the next week. Do not stop taking this early.  Please follow-up with your regular doctor regarding your symptoms. If they worsen once again, you need to return to the emergency department immediately for reevaluation.

## 2020-08-08 NOTE — ED Provider Notes (Signed)
[facility]-EMERGENCY DEPT Provider Note   CSN: [id number] Arrival date & time: 08/08/20  1446     History Chief Complaint  Patient presents with  . Chest Pain  . Near Syncope  . Shortness of Breath  . Leg Pain    Kelsey Collins is a 24 y.o. female.  HPI   Patient is a 24 year old female with a medical history as noted below.  Patient presents today with worsening left leg pain and swelling.  She was initially evaluated on 11/15 with similar complaints.  She was discharged on Naprosyn and prednisone.  She then presented to the emergency department on 12/1.  She had an ultrasound obtained of the left lower extremity which was concerning for thrombophlebitis around the varicose vein in the area of her discomfort.  Previous provider immobilize the ankle gave patient crutches and PCP follow-up.  Patient states that her symptoms have continued to worsen.  Feels that her pain now radiates up her lower leg with worsening swelling.  She noted to triage that she was experiencing chest pain, shortness of breath, and near syncope.  Upon further discussion she states that she just feels "much worse" when her leg pain worsens.  Denies any chest pain, shortness of breath, or syncope to me.  No fevers, chills, vomiting, abdominal pain, urinary changes.  No recent travel, recent surgeries/trauma, history of blood clots, oral estrogen use.       Past Medical History:  Diagnosis Date  . Anemia   . Anxiety   . Depression   . OCD (obsessive compulsive disorder)     Patient Active Problem List   Diagnosis Date Noted  . 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 Encounter for IUD insertion 05/13/2016  . Breakthrough bleeding on Nexplanon 05/12/2016  . High risk sexual behavior 05/12/2016  . Obesity 05/12/2016  . Acute drug intoxication with delirium (HCC) 05/10/2014  . PTSD (post-traumatic stress disorder) 05/03/2014  . Moderate  recurrent major depression (HCC) 05/03/2014    History reviewed. No pertinent surgical history.   OB History    Gravida  1   Para      Term      Preterm      AB  1   Living        SAB  1   IAB      Ectopic      Multiple      Live Births              Family History  Problem Relation Age of Onset  . Cancer Maternal Aunt   . Cancer Maternal Grandmother   . Depression Maternal Grandfather   . Diabetes Maternal Grandfather     Social History   Tobacco Use  . Smoking status: Current Some Day Smoker    Types: Cigarettes  . Smokeless tobacco: Never Used  . Tobacco comment: 1-2 black and milds/everyday  Vaping Use  . Vaping Use: Never used  Substance Use Topics  . Alcohol use: Yes    Alcohol/week: 0.0 standard drinks    Comment: socially  . Drug use: Yes    Types: Marijuana    Comment: everyday     Home Medications Prior to Admission medications   Medication Sig Start Date End Date Taking? Authorizing Provider  acetaminophen (TYLENOL) 325 MG tablet Take 650 mg by mouth every 6 (six) hours as needed for mild pain, fever or headache.    [provider]  citalopram (CELEXA) 10 MG tablet Take 1 tablet (10 mg total) by mouth daily. For mood control Patient not taking: Reported on 07/30/2020 06/21/18   Money, Gerlene Burdock, FNP  metroNIDAZOLE (FLAGYL) 500 MG tablet Take 1 tablet (500 mg total) by mouth 2 (two) times daily. 07/16/20   Lamptey, Britta Mccreedy, MD  naproxen (NAPROSYN) 500 MG tablet Take 1 tablet (500 mg total) by mouth 2 (two) times daily as needed. Patient taking differently: Take 500 mg by mouth 2 (two) times daily as needed for mild pain.  07/14/20   Particia Nearing, PA-C  predniSONE (DELTASONE) 20 MG tablet Take 2 tablets (40 mg total) by mouth daily with breakfast. 07/14/20   Particia Nearing, PA-C    Allergies    Dimetapp [albertsons di bromm], Other, and Pineapple  Review of Systems   Review of Systems  All other systems  reviewed and are negative. Ten systems reviewed and are negative for acute change, except as noted in the HPI.    Physical Exam Updated Vital Signs BP (!) 140/99 (BP Location: Left Arm)   Pulse (!) 57   Temp 98.5 F (36.9 C) (Oral)   Resp 14   Ht 5\' 4"  (1.626 m)   Wt 97.5 kg   SpO2 99%   BMI 36.90 kg/m   Physical Exam Vitals and nursing note reviewed.  Constitutional:      General: She is not in acute distress.    Appearance: Normal appearance. She is not ill-appearing, toxic-appearing or diaphoretic.  HENT:     Head: Normocephalic and atraumatic.     Right Ear: External ear normal.     Left Ear: External ear normal.     Nose: Nose normal.     Mouth/Throat:     Mouth: Mucous membranes are moist.     Pharynx: Oropharynx is clear. No oropharyngeal exudate or posterior oropharyngeal erythema.  Eyes:     Extraocular Movements: Extraocular movements intact.  Cardiovascular:     Rate and Rhythm: Normal rate and regular rhythm.     Pulses: Normal pulses.          Radial pulses are 2+ on the right side and 2+ on the left side.       Dorsalis pedis pulses are 2+ on the right side and 2+ on the left side.     Heart sounds: Normal heart sounds. No murmur heard. No friction rub. No gallop.   Pulmonary:     Effort: Pulmonary effort is normal. No respiratory distress.     Breath sounds: Normal breath sounds. No stridor. No wheezing, rhonchi or rales.  Abdominal:     General: Abdomen is flat.     Tenderness: There is no abdominal tenderness.  Musculoskeletal:        General: Normal range of motion.     Cervical back: Normal range of motion and neck supple. No tenderness.     Right lower leg: No tenderness. No edema.     Left lower leg: Tenderness present. Edema present.     Comments: 1+ edema noted in the left lower extremity.  Seems to be worse circumferentially around the left ankle.  Diffuse tenderness appreciated throughout the dorsum of the left foot, left ankle, left calf, and  left posterior thigh.  Skin:    General: Skin is warm and dry.  Neurological:     General: No focal deficit present.     Mental Status: She is alert and oriented to person, place, and time.  Psychiatric:        Mood and Affect: Mood normal.        Behavior: Behavior normal.    ED Results / Procedures / Treatments   Labs (all labs ordered are listed, but only abnormal results are displayed) Labs Reviewed  BASIC METABOLIC PANEL - Abnormal; Notable for the following components:      Result Value   Sodium 134 (*)    CO2 21 (*)    All other components within normal limits  CBC - Abnormal; Notable for the following components:   WBC 12.5 (*)    All other components within normal limits  I-STAT BETA HCG BLOOD, ED ([facility], [facility], [facility] ONLY)  TROPONIN I (HIGH SENSITIVITY)   EKG EKG Interpretation  Date/Time:  Friday August 08 2020 14:58:57 EST Ventricular Rate:  89 PR Interval:    QRS Duration: 75 QT Interval:  332 QTC Calculation: 404 R Axis:   58 Text Interpretation: Sinus rhythm Atrial premature complex Borderline repolarization abnormality 12 Lead; Mason-Likar No significant change since last tracing Confirmed by Richardean Canal [phone]) on 08/08/2020 4:55:08 PM  Radiology DG Chest 2 View  Result Date: 08/08/2020 CLINICAL DATA:  Chest pain. EXAM: CHEST - 2 VIEW COMPARISON:  June 20, 2016. FINDINGS: The heart size and mediastinal contours are within normal limits. Both lungs are clear. No pneumothorax or pleural effusion is noted. The visualized skeletal structures are unremarkable. IMPRESSION: No active cardiopulmonary disease. Electronically Signed   By: Lupita Raider M.D.   On: 08/08/2020 15:59   VAS [place] LOWER EXTREMITY VENOUS (DVT) (ONLY [facility] & [facility])  Result Date: 08/08/2020  Lower Venous DVT Study Indications: Edema.  Comparison Study: previous 07/30/20 Performing Technologist: Blanch Media RVS  Examination Guidelines: A complete evaluation includes B-mode imaging, spectral Doppler,  color Doppler, and power Doppler as needed of all accessible portions of each vessel. Bilateral testing is considered an integral part of a complete examination. Limited examinations for reoccurring indications may be performed as noted. The reflux portion of the exam is performed with the patient in reverse Trendelenburg.  +-----+---------------+---------+-----------+----------+--------------+ RIGHTCompressibilityPhasicitySpontaneityPropertiesThrombus Aging +-----+---------------+---------+-----------+----------+--------------+ CFV  Full           Yes      Yes                                 +-----+---------------+---------+-----------+----------+--------------+   +---------+---------------+---------+-----------+----------+--------------+ LEFT     CompressibilityPhasicitySpontaneityPropertiesThrombus Aging +---------+---------------+---------+-----------+----------+--------------+ CFV      Full           Yes      Yes                                 +---------+---------------+---------+-----------+----------+--------------+ SFJ      Full                                                        +---------+---------------+---------+-----------+----------+--------------+ FV Prox  Full                                                        +---------+---------------+---------+-----------+----------+--------------+  FV Mid   Full                                                        +---------+---------------+---------+-----------+----------+--------------+ FV DistalFull                                                        +---------+---------------+---------+-----------+----------+--------------+ PFV      Full                                                        +---------+---------------+---------+-----------+----------+--------------+ POP      Full           Yes      Yes                                  +---------+---------------+---------+-----------+----------+--------------+ PTV      Full                                                        +---------+---------------+---------+-----------+----------+--------------+ PERO     Full                                                        +---------+---------------+---------+-----------+----------+--------------+     Summary: RIGHT: - No evidence of common femoral vein obstruction.  LEFT: - There is no evidence of deep vein thrombosis in the lower extremity.  - No cystic structure found in the popliteal fossa.  *See table(s) above for measurements and observations.    Preliminary    Procedures Procedures   Medications Ordered in ED Medications  morphine 2 MG/ML injection 2 mg (2 mg Intravenous Given 08/08/20 1710)  clindamycin (CLEOCIN) IVPB 600 mg (0 mg Intravenous Stopped 08/08/20 1851)   ED Course  I have reviewed the triage vital signs and the nursing notes.  Pertinent labs & imaging results that were available during my care of the patient were reviewed by me and considered in my medical decision making (see chart for details).    MDM Rules/Calculators/A&P                          Patient is a 24 year old female who presents to the emergency department with worsening left leg pain and swelling.  Patient was recently evaluated in the emergency department and diagnosed with a possible thrombophlebitis.  She returns today with worsening symptoms.  Concern for worsening thrombophlebitis versus DVT.  Repeat ultrasound was obtained of the left lower extremity which was negative for acute DVT.  Also possible developing cellulitis noted to the  left lower extremity.  Secondary to thrombophlebitis?  Mild leukocytosis on CBC of 12.5.  BMP generally reassuring with a mild hyponatremia of 134.  Troponin less than 2.  I-STAT beta-hCG less than 5.  Patient discussed with my attending physician Dr. Chaney Malling.  Will start on ASA.  We will  also start on clindamycin for possible cellulitis.  First dose given in the emergency department.  Recommended PCP follow-up.  Return to the ER if her symptoms worsen or do not improve.  Her questions were answered and she was amicable at the time of discharge.  Her vital signs are stable.   Final Clinical Impression(s) / ED Diagnoses Final diagnoses:  Thrombophlebitis  Pain of left lower extremity  Leg swelling    Rx / DC Orders ED Discharge Orders         Ordered    aspirin 81 MG chewable tablet  Daily        08/08/20 1808    clindamycin (CLEOCIN) 150 MG capsule  3 times daily        08/08/20 1808           Placido Sou, PA-C 08/09/20 1612    Charlynne Pander, MD 08/10/20 [phone]

## 2020-09-02 ENCOUNTER — Emergency Department (HOSPITAL_BASED_OUTPATIENT_CLINIC_OR_DEPARTMENT_OTHER)
Admit: 2020-09-02 | Discharge: 2020-09-02 | Disposition: A | Discharge status: Home / Self Care | Payer: Self-pay | Attending: Physician Assistant | Admitting: Physician Assistant

## 2020-09-02 ENCOUNTER — Emergency Department (HOSPITAL_COMMUNITY)
Admission: EM | Admit: 2020-09-02 | Discharge: 2020-09-02 | Disposition: A | Discharge status: Home / Self Care | Payer: Self-pay | Attending: Emergency Medicine | Admitting: Emergency Medicine

## 2020-09-02 ENCOUNTER — Encounter (HOSPITAL_COMMUNITY): Payer: Self-pay | Admitting: Emergency Medicine

## 2020-09-02 DIAGNOSIS — R2242 Localized swelling, mass and lump, left lower limb: Secondary | ICD-10-CM | POA: Insufficient documentation

## 2020-09-02 DIAGNOSIS — Z79899 Other long term (current) drug therapy: Secondary | ICD-10-CM | POA: Insufficient documentation

## 2020-09-02 DIAGNOSIS — R609 Edema, unspecified: Secondary | ICD-10-CM

## 2020-09-02 DIAGNOSIS — F17210 Nicotine dependence, cigarettes, uncomplicated: Secondary | ICD-10-CM | POA: Insufficient documentation

## 2020-09-02 DIAGNOSIS — M79605 Pain in left leg: Secondary | ICD-10-CM | POA: Insufficient documentation

## 2020-09-02 DIAGNOSIS — M79609 Pain in unspecified limb: Secondary | ICD-10-CM

## 2020-09-02 DIAGNOSIS — Z7982 Long term (current) use of aspirin: Secondary | ICD-10-CM | POA: Insufficient documentation

## 2020-09-02 LAB — D-DIMER, QUANTITATIVE: D-Dimer, Quant: 0.83 ug/mL-FEU — ABNORMAL HIGH (ref 0.00–0.50)

## 2020-09-02 MED ORDER — KETOROLAC TROMETHAMINE 30 MG/ML IJ SOLN
30.0000 mg | Freq: Once | INTRAMUSCULAR | Status: AC
  Administered 2020-09-02: 30 mg via INTRAMUSCULAR
  Filled 2020-09-02: qty 1

## 2020-09-02 NOTE — Discharge Instructions (Signed)
You are seen today for calf pain, as we discussed this is most likely musculoskeletal in nature.  I want you to follow-up with orthopedics.  Continue to rest your leg, keep it in your foot boot, continue to take ibuprofen or Aleve as directed on the bottle and ice this area.  Do elevate your leg as well.  If you have any new or worsening concerning symptoms such as chest pain or shortness of breath or worsening leg pain with fevers and redness please come back to the emergency department.

## 2020-09-02 NOTE — ED Provider Notes (Signed)
Care of the patient was assumed from K. Sophia PA-C at 76; see this physician's note for complete history of present illness, review of systems, and physical exam.  Briefly, the patient is a 25 y.o. female who presented to the ED with leg pain.  Patient states that since September left leg has been bothering her.  States that she has been to multiple urgent cares and her primary care, has diagnosed her with tendinitis.  Patient states that she has been in a cam walker, steroids and has been taking NSAIDs without any relief.  Patient came here because she was concerned for blood clot.  Previous provider had ordered a D-dimer which was positive therefore did order DVT study.  Patient is tachycardic, denies any chest pain or shortness of breath.  When speaking to previous provider about this, they are not concerned about PE.  Plan at time of handoff: Awaiting DVT study.     Physical Exam  BP 120/79 (BP Location: Left Arm)   Pulse (!) 118   Temp 98.1 F (36.7 C) (Oral)   Resp (!) 22   SpO2 100%   Physical Exam Constitutional:      General: She is not in acute distress.    Appearance: Normal appearance. She is not ill-appearing, toxic-appearing or diaphoretic.  Cardiovascular:     Rate and Rhythm: Normal rate and regular rhythm.     Pulses: Normal pulses.  Pulmonary:     Effort: Pulmonary effort is normal. No respiratory distress.     Breath sounds: Normal breath sounds. No stridor. No wheezing or rhonchi.  Musculoskeletal:        General: Normal range of motion.       Legs:     Comments: Left lower leg pain as depicted above.  Patient is able to range leg in all directions.  No erythema, no warmth.  No rashes.  Compartments are soft.  Normal leg raise.  Good strength to bilateral lower extremities with normal sensation.  PT pulses 2+.  Antalgic gait, however patient is able to bear weight.  Skin:    General: Skin is warm and dry.     Capillary Refill: Capillary refill takes less than 2  seconds.  Neurological:     General: No focal deficit present.     Mental Status: She is alert and oriented to person, place, and time.  Psychiatric:        Mood and Affect: Mood normal.        Behavior: Behavior normal.        Thought Content: Thought content normal.     ED Course/Procedures     Procedures  MDM   25 year old female presenting to the emergency department today for left lower leg pain.  Patient is distally neurovascularly intact.  DVT study negative.  No concerns for cellulitis.  Will give Toradol and reevaluate, patient denies any chance of pregnancy, states that she does have IUD in place.  DVT study with some lymph nodes in the groin area, did speak to patient about this, she states that she was recently diagnosed with trichomonas and just completed course of antibiotics yesterday.  Did express to patient that she will need to have this reevaluated if these do not look down, patient expressed understanding.  Is asymptomatic in regards to her trichomonas.  Shared decision making about PE study at this time due to tachycardia with positive D-dimer.  I discussed that there could be other reasons for elevated D-dimer, patient is not  complaining of any shortness of breath or chest pain.  Has no risk factors for PE. Marland Kitchen  After Toradol given in pain controlled, pulse reduced to 88.  Patient states that she feels much better, patient to be discharged at this time. Strict return precautions given for patient, patient appears very agreeable.  Symptomatic treatment discussed, patient states that she already has cam walker and crutches at home.  Patient will follow up with sports medicine, I think patient will benefit from PT at this time. Doubt need for further emergent work up at this time. I explained the diagnosis and have given explicit precautions to return to the ER including for any other new or worsening symptoms. The patient understands and accepts the medical plan as it's been  dictated and I have answered their questions. Discharge instructions concerning home care and prescriptions have been given. The patient is STABLE and is discharged to home in good condition.     Farrel Gordon, PA-C 09/02/20 1845    Gerhard Munch, MD 09/05/20 [phone]

## 2020-09-02 NOTE — Progress Notes (Signed)
Left lower extremity venous study completed.   Results given to RN.   Please see CV Proc for preliminary results.   Tyshell Ramberg, RVT  

## 2020-09-02 NOTE — ED Provider Notes (Signed)
[facility]-EMERGENCY DEPT Provider Note   CSN: [id number] Arrival date & time: 09/02/20  4270     History Chief Complaint  Patient presents with  . Leg Pain    Kelsey Collins is a 25 y.o. female.  The history is provided by the patient. No language interpreter was used.  Leg Pain Location:  Leg Leg location:  L lower leg and L upper leg Pain details:    Quality:  Aching   Severity:  Moderate   Onset quality:  Gradual   Timing:  Constant   Progression:  Worsening Relieved by:  Nothing Worsened by:  Nothing Risk factors: no concern for non-accidental trauma   Pt reports a bruised swollen area came up on her lower leg.  Pt states she was diagnosed with tendonitis.  Pt states leg is painful in the top of her leg now.  Pt reports pain was in ankle and calf.      Past Medical History:  Diagnosis Date  . Anemia   . Anxiety   . Depression   . OCD (obsessive compulsive disorder)     Patient Active Problem List   Diagnosis Date Noted  . 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 Encounter for IUD insertion 05/13/2016  . Breakthrough bleeding on Nexplanon 05/12/2016  . High risk sexual behavior 05/12/2016  . Obesity 05/12/2016  . Acute drug intoxication with delirium (HCC) 05/10/2014  . PTSD (post-traumatic stress disorder) 05/03/2014  . Moderate recurrent major depression (HCC) 05/03/2014    History reviewed. No pertinent surgical history.   OB History    Gravida  1   Para      Term      Preterm      AB  1   Living        SAB  1   IAB      Ectopic      Multiple      Live Births              Family History  Problem Relation Age of Onset  . Cancer Maternal Aunt   . Cancer Maternal Grandmother   . Depression Maternal Grandfather   . Diabetes Maternal Grandfather     Social History   Tobacco Use  . Smoking status: Current Some Day Smoker    Types: Cigarettes  . Smokeless  tobacco: Never Used  . Tobacco comment: 1-2 black and milds/everyday  Vaping Use  . Vaping Use: Never used  Substance Use Topics  . Alcohol use: Yes    Alcohol/week: 0.0 standard drinks    Comment: socially  . Drug use: Yes    Types: Marijuana    Comment: everyday     Home Medications Prior to Admission medications   Medication Sig Start Date End Date Taking? Authorizing Provider  acetaminophen (TYLENOL) 325 MG tablet Take 650 mg by mouth every 6 (six) hours as needed for mild pain, fever or headache.    [provider]  aspirin 81 MG chewable tablet Chew 4 tablets (324 mg total) by mouth daily. 08/08/20 09/07/20  Placido Sou, PA-C  citalopram (CELEXA) 10 MG tablet Take 1 tablet (10 mg total) by mouth daily. For mood control Patient not taking: Reported on 07/30/2020 06/21/18   Money, Gerlene Burdock, FNP  naproxen (NAPROSYN) 500 MG tablet Take 1 tablet (500 mg total) by mouth 2 (two) times daily as needed. Patient taking differently: Take 500 mg by mouth 2 (two) times  daily as needed for mild pain.  07/14/20   Particia Nearing, PA-C  predniSONE (DELTASONE) 20 MG tablet Take 2 tablets (40 mg total) by mouth daily with breakfast. 07/14/20   Particia Nearing, PA-C    Allergies    Dimetapp [albertsons di bromm], Other, and Pineapple  Review of Systems   Review of Systems  All other systems reviewed and are negative.   Physical Exam Updated Vital Signs BP 120/79 (BP Location: Left Arm)   Pulse (!) 118   Temp 98.1 F (36.7 C) (Oral)   Resp (!) 22   SpO2 100%   Physical Exam Vitals and nursing note reviewed.  Constitutional:      Appearance: She is well-developed and well-nourished.  HENT:     Head: Normocephalic.  Eyes:     Extraocular Movements: EOM normal.  Cardiovascular:     Rate and Rhythm: Normal rate.  Pulmonary:     Effort: Pulmonary effort is normal.  Abdominal:     General: There is no distension.  Musculoskeletal:        General: Swelling  and tenderness present.     Comments: Bruised lower leg, tender calf and hamstring area.    Neurological:     Mental Status: She is alert and oriented to person, place, and time.  Psychiatric:        Mood and Affect: Mood and affect and mood normal.     ED Results / Procedures / Treatments   Labs (all labs ordered are listed, but only abnormal results are displayed) Labs Reviewed  D-DIMER, QUANTITATIVE (NOT AT [facility]) - Abnormal; Notable for the following components:      Result Value   D-Dimer, Quant 0.83 (*)    All other components within normal limits    EKG None  Radiology No results found.  Procedures Procedures (including critical care time)  Medications Ordered in ED Medications - No data to display  ED Course  I have reviewed the triage vital signs and the nursing notes.  Pertinent labs & imaging results that were available during my care of the patient were reviewed by me and considered in my medical decision making (see chart for details).    MDM Rules/Calculators/A&P                          MDM:  ddimer is positive  Doppler study ordered  Final Clinical Impression(s) / ED Diagnoses Final diagnoses:  None    Rx / DC Orders ED Discharge Orders    None       Osie Cheeks 09/02/20 1512    Bethann Berkshire, MD 09/04/20 1034

## 2020-09-02 NOTE — ED Triage Notes (Signed)
Per pt, states she has been having left leg pain since 08/08/20-states she was diagnosed with tendonitis-states pain has not gotten better-thinks she might have a clot
